# Patient Record
Sex: Male | Born: 1990 | Race: White | Hispanic: Yes | Smoking: Never smoker
Health system: Southern US, Community
[De-identification: ages and names within clinical notes are randomized; demographics above are authoritative.]

## PROBLEM LIST (undated history)

## (undated) DIAGNOSIS — T7840XA Allergy, unspecified, initial encounter: Secondary | ICD-10-CM

## (undated) DIAGNOSIS — I10 Essential (primary) hypertension: Secondary | ICD-10-CM

## (undated) HISTORY — DX: Allergy, unspecified, initial encounter: T78.40XA

---

## 2011-06-22 ENCOUNTER — Encounter: Payer: Self-pay | Admitting: *Deleted

## 2011-06-22 ENCOUNTER — Emergency Department (HOSPITAL_COMMUNITY)
Admission: EM | Admit: 2011-06-22 | Discharge: 2011-06-22 | Disposition: A | Discharge status: Home / Self Care | Payer: Self-pay | Attending: Emergency Medicine | Admitting: Emergency Medicine

## 2011-06-22 DIAGNOSIS — R109 Unspecified abdominal pain: Secondary | ICD-10-CM

## 2011-06-22 DIAGNOSIS — R509 Fever, unspecified: Secondary | ICD-10-CM | POA: Insufficient documentation

## 2011-06-22 DIAGNOSIS — R197 Diarrhea, unspecified: Secondary | ICD-10-CM | POA: Insufficient documentation

## 2011-06-22 DIAGNOSIS — R112 Nausea with vomiting, unspecified: Secondary | ICD-10-CM | POA: Insufficient documentation

## 2011-06-22 DIAGNOSIS — R1013 Epigastric pain: Secondary | ICD-10-CM | POA: Insufficient documentation

## 2011-06-22 LAB — COMPREHENSIVE METABOLIC PANEL
AST: 18 U/L (ref 0–37)
CO2: 26 mEq/L (ref 19–32)
Calcium: 9.5 mg/dL (ref 8.4–10.5)
Chloride: 99 mEq/L (ref 96–112)
Creatinine, Ser: 0.8 mg/dL (ref 0.50–1.35)
GFR calc Af Amer: >90 mL/min (ref >90)
GFR calc non Af Amer: >90 mL/min (ref >90)
Glucose, Bld: 106 mg/dL — ABNORMAL HIGH (ref 70–99)
Total Bilirubin: 0.8 mg/dL (ref 0.3–1.2)

## 2011-06-22 LAB — DIFFERENTIAL
Basophils Relative: 0 % (ref 0–1)
Eosinophils Absolute: 0 10*3/uL (ref 0.0–0.7)
Lymphs Abs: 0.8 10*3/uL (ref 0.7–4.0)
Neutro Abs: 10.9 10*3/uL — ABNORMAL HIGH (ref 1.7–7.7)
Neutrophils Relative %: 88 % — ABNORMAL HIGH (ref 43–77)

## 2011-06-22 LAB — CBC
MCH: 28.2 pg (ref 26.0–34.0)
MCHC: 34 g/dL (ref 30.0–36.0)
Platelets: 237 10*3/uL (ref 150–400)
RBC: 5.11 MIL/uL (ref 4.22–5.81)

## 2011-06-22 LAB — LIPASE, BLOOD: Lipase: 17 U/L (ref 11–59)

## 2011-06-22 MED ORDER — ONDANSETRON HCL 4 MG/2ML IJ SOLN
4.0000 mg | Freq: Once | INTRAMUSCULAR | Status: AC
  Administered 2011-06-22: 4 mg via INTRAVENOUS
  Filled 2011-06-22: qty 2

## 2011-06-22 MED ORDER — SODIUM CHLORIDE 0.9 % IV SOLN
999.0000 mL | Freq: Once | INTRAVENOUS | Status: AC
  Administered 2011-06-22: 999 mL via INTRAVENOUS

## 2011-06-22 MED ORDER — ONDANSETRON HCL 4 MG PO TABS
4.0000 mg | ORAL_TABLET | Freq: Four times a day (QID) | ORAL | Status: AC
Start: 2011-06-22 — End: 2011-06-29

## 2011-06-22 MED ORDER — GI COCKTAIL ~~LOC~~
30.0000 mL | Freq: Once | ORAL | Status: AC
  Administered 2011-06-22: 30 mL via ORAL
  Filled 2011-06-22: qty 30

## 2011-06-22 NOTE — ED Notes (Signed)
Reports waking up this am with fever, abd pain, nausea and diarrhea. Mask on pt at triage. HR 130.

## 2011-06-22 NOTE — ED Provider Notes (Cosign Needed)
History     CSN: [id number] Arrival date & time: 06/22/2011 12:13 PM   First MD Initiated Contact with Patient 06/22/11 1338      Chief Complaint  Patient presents with  . Fever  . Abdominal Pain    (Consider location/radiation/quality/duration/timing/severity/associated sxs/prior treatment) HPI The patient presents with 8 hours of abdominal discomfort, nausea, vomiting, diarrhea. The patient is a yesterday he was in his usual state of health, and soon after awakening today he noticed his symptoms. Since that time his symptoms have been persistent, in spite of Pepto-Bismol, other oral medications. He now notes by mouth intolerance, continued fever, continued nausea, abdominal pain (epigastric and nonradiating, sharp, burning) History reviewed. No pertinent past medical history.  History reviewed. No pertinent past surgical history.  History reviewed. No pertinent family history.  History  Substance Use Topics  . Smoking status: Not on file  . Smokeless tobacco: Not on file  . Alcohol Use: No      Review of Systems  Constitutional:       Per HPI, otherwise negative  HENT:       Per HPI, otherwise negative  Eyes: Negative.   Respiratory:       Per HPI, otherwise negative  Cardiovascular:       Per HPI, otherwise negative  Gastrointestinal: Positive for nausea, vomiting and diarrhea. Negative for rectal pain.  Genitourinary: Negative.   Musculoskeletal:       Per HPI, otherwise negative  Skin: Negative.   Neurological: Negative for syncope.    Allergies  Review of patient's allergies indicates no known allergies.  Home Medications   Current Outpatient Rx  Name Route Sig Dispense Refill  . ACETAMINOPHEN 500 MG PO TABS Oral Take 1,000 mg by mouth every 6 (six) hours as needed. For pain and fever     . ASPIRIN EFFERVESCENT 325 MG PO TBEF Oral Take 325 mg by mouth every 6 (six) hours as needed. For diarrhea       BP 123/70  Pulse 120  Temp(Src) 98.7 F (37.1  C) (Oral)  Resp 20  SpO2 95%  Physical Exam  Nursing note and vitals reviewed. Constitutional: He is oriented to person, place, and time. He appears well-developed and well-nourished.  HENT:  Head: Normocephalic and atraumatic.  Eyes: Conjunctivae are normal. Pupils are equal, round, and reactive to light.  Neck: Neck supple.  Cardiovascular: Regular rhythm.  Tachycardia present.   Pulmonary/Chest: No respiratory distress.  Abdominal: Soft. There is tenderness.  Musculoskeletal: He exhibits no edema.  Neurological: He is alert and oriented to person, place, and time.  Skin: Skin is warm and dry.  Psychiatric: He has a normal mood and affect.    ED Course  Procedures (including critical care time)   Labs Reviewed  COMPREHENSIVE METABOLIC PANEL  CBC  DIFFERENTIAL  LIPASE, BLOOD   No results found.   No diagnosis found.    MDM  This previously well young male presents with acute onset abdominal pain nausea, diarrhea. On exam the patient is in no distress though he appears uncomfortable. Minimal discomfort on exam about the epigastrium as well as his symptoms is suggestive of acute gastroenteritis. Following IV fluids, GI cocktail, Zofran the patient described a significant improvement in his condition. The patient's vital signs also improved notably. Given this improvement, the absence of abnormal laboratory values and the resolution of the patient's vital signs abnormalities he is appropriate for outpatient continued evaluation and management of his symptoms.  Gerhard Munch, MD 06/22/11 [phone]

## 2012-10-21 ENCOUNTER — Ambulatory Visit: Payer: Self-pay | Admitting: Family Medicine

## 2012-10-21 VITALS — BP 130/85 | HR 74 | Temp 98.4°F | Resp 16 | Ht 70.5 in | Wt 243.8 lb

## 2012-10-21 DIAGNOSIS — J039 Acute tonsillitis, unspecified: Secondary | ICD-10-CM

## 2012-10-21 DIAGNOSIS — J329 Chronic sinusitis, unspecified: Secondary | ICD-10-CM

## 2012-10-21 MED ORDER — CHLORHEXIDINE GLUCONATE 0.12 % MT SOLN: 15.0000 mL | Freq: Two times a day (BID) | OROMUCOSAL | Status: DC

## 2012-10-21 MED ORDER — AMOXICILLIN 875 MG PO TABS: 875.0000 mg | ORAL_TABLET | Freq: Two times a day (BID) | ORAL | Status: DC

## 2012-10-21 NOTE — Progress Notes (Signed)
22 yo man with two days of progressive left throat swelling, nasal congestion and cough.  No h/o asthma.  Nonsmoker. No alcohol Patient works at call center in [place]  Obj: NAD Skin warm and dry Throat:  Swollen left tonsil without exudate Neck:  Supple, no adenop Chest:  Clear Heart:  Reg, no murmur  Assessment:  Mild early tonsillitis  Plan:  No work for 48 hour Amoxil 875 bid

## 2013-10-08 ENCOUNTER — Ambulatory Visit: Payer: Self-pay | Admitting: Family Medicine

## 2013-10-08 VITALS — BP 128/84 | HR 96 | Temp 97.9°F | Resp 16 | Ht 70.5 in | Wt 235.6 lb

## 2013-10-08 DIAGNOSIS — J020 Streptococcal pharyngitis: Secondary | ICD-10-CM

## 2013-10-08 DIAGNOSIS — J039 Acute tonsillitis, unspecified: Secondary | ICD-10-CM

## 2013-10-08 DIAGNOSIS — R221 Localized swelling, mass and lump, neck: Secondary | ICD-10-CM

## 2013-10-08 DIAGNOSIS — H9209 Otalgia, unspecified ear: Secondary | ICD-10-CM

## 2013-10-08 DIAGNOSIS — J329 Chronic sinusitis, unspecified: Secondary | ICD-10-CM

## 2013-10-08 DIAGNOSIS — R220 Localized swelling, mass and lump, head: Secondary | ICD-10-CM

## 2013-10-08 LAB — POCT CBC
Granulocyte percent: 72.9 %G (ref 37–80)
HCT, POC: 44.1 % (ref 43.5–53.7)
HEMOGLOBIN: 14.2 g/dL (ref 14.1–18.1)
LYMPH, POC: 1.7 (ref 0.6–3.4)
MCH, POC: 27.9 pg (ref 27–31.2)
MCHC: 32.2 g/dL (ref 31.8–35.4)
MCV: 86.6 fL (ref 80–97)
MID (CBC): 0.4 (ref 0–0.9)
MPV: 8.4 fL (ref 0–99.8)
PLATELET COUNT, POC: 389 10*3/uL (ref 142–424)
POC GRANULOCYTE: 5.8 (ref 2–6.9)
POC LYMPH %: 21.8 % (ref 10–50)
POC MID %: 5.3 % (ref 0–12)
RBC: 5.09 M/uL (ref 4.69–6.13)
RDW, POC: 13.8 %
WBC: 7.9 10*3/uL (ref 4.6–10.2)

## 2013-10-08 LAB — POCT RAPID STREP A (OFFICE): Rapid Strep A Screen: POSITIVE — AB

## 2013-10-08 MED ORDER — AMOXICILLIN 875 MG PO TABS: 875.0000 mg | ORAL_TABLET | Freq: Two times a day (BID) | ORAL | Status: DC

## 2013-10-08 NOTE — Patient Instructions (Signed)
Take all antibiotic as ordered Ibuprofen 400-600 mg every 8 hours for pain  Strep Throat Strep throat is an infection of the throat caused by a bacteria named Streptococcus pyogenes. Your caregiver may call the infection streptococcal "tonsillitis" or "pharyngitis" depending on whether there are signs of inflammation in the tonsils or back of the throat. Strep throat is most common in children aged 22 15 years during the cold months of the year, but it can occur in people of any age during any season. This infection is spread from person to person (contagious) through coughing, sneezing, or other close contact. SYMPTOMS   Fever or chills.  Painful, swollen, red tonsils or throat.  Pain or difficulty when swallowing.  White or yellow spots on the tonsils or throat.  Swollen, tender lymph nodes or "glands" of the neck or under the jaw.  Red rash all over the body (rare). DIAGNOSIS  Many different infections can cause the same symptoms. A test must be done to confirm the diagnosis so the right treatment can be given. A "rapid strep test" can help your caregiver make the diagnosis in a few minutes. If this test is not available, a light swab of the infected area can be used for a throat culture test. If a throat culture test is done, results are usually available in a day or two. TREATMENT  Strep throat is treated with antibiotic medicine. HOME CARE INSTRUCTIONS   Gargle with 1 tsp of salt in 1 cup of warm water, 3 4 times per day or as needed for comfort.  Family members who also have a sore throat or fever should be tested for strep throat and treated with antibiotics if they have the strep infection.  Make sure everyone in your household washes their hands well.  Do not share food, drinking cups, or personal items that could cause the infection to spread to others.  You may need to eat a soft food diet until your sore throat gets better.  Drink enough water and fluids to keep your  urine clear or pale yellow. This will help prevent dehydration.  Get plenty of rest.  Stay home from school, daycare, or work until you have been on antibiotics for 24 hours.  Only take over-the-counter or prescription medicines for pain, discomfort, or fever as directed by your caregiver.  If antibiotics are prescribed, take them as directed. Finish them even if you start to feel better. SEEK MEDICAL CARE IF:   The glands in your neck continue to enlarge.  You develop a rash, cough, or earache.  You cough up green, yellow-brown, or bloody sputum.  You have pain or discomfort not controlled by medicines.  Your problems seem to be getting worse rather than better. SEEK IMMEDIATE MEDICAL CARE IF:   You develop any new symptoms such as vomiting, severe headache, stiff or painful neck, chest pain, shortness of breath, or trouble swallowing.  You develop severe throat pain, drooling, or changes in your voice.  You develop swelling of the neck, or the skin on the neck becomes red and tender.  You have a fever.  You develop signs of dehydration, such as fatigue, dry mouth, and decreased urination.  You become increasingly sleepy, or you cannot wake up completely. Document Released: 06/24/2000 Document Revised: 06/13/2012 Document Reviewed: 08/26/2010 [facility] Patient Information 2014 [place].

## 2013-10-08 NOTE — Progress Notes (Signed)
Subjective:    Patient ID: Jackson Wallace, male    DOB: 1990-09-01, 23 y.o.   MRN: [id number]  HPI Woke up in the middle of the night 4 days ago with a sharp pain in his left ear. The next morning, used some over the counter ear drops with some improvement of pain. Has noticed progressively worse pain radiating from ear, hurts with eating, feels swollen. Did not note any drainage from ear.  Took tylenol with some relief of feeling hot, did not help pain.  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   Review of Systems Felt hot yesterday, No nasal drainage, no sore throat, no cough. Hearing normal in ear.     Objective:   Physical Exam  Vitals reviewed. Constitutional: He is oriented to person, place, and time. He appears well-developed and well-nourished.  HENT:  Head: Normocephalic and atraumatic.  Right Ear: Hearing and external ear normal.  Left Ear: Hearing normal. There is tenderness.  Nose: Nose normal.  Mouth/Throat: Oropharynx is clear and moist.  Left ear canal with hypervascularization.  Eyes: Conjunctivae are normal. Right eye exhibits no discharge. Left eye exhibits no discharge.  Neck: Normal range of motion. Neck supple.  Left anterior cervical lymph nodes present and tender.  Cardiovascular: Normal rate, regular rhythm and normal heart sounds.   Pulmonary/Chest: Effort normal and breath sounds normal.  Musculoskeletal: Normal range of motion.  Lymphadenopathy:    He has cervical adenopathy.  Neurological: He is alert and oriented to person, place, and time.  Skin: Skin is warm and dry.  Psychiatric: He has a normal mood and affect. His behavior is normal. Judgment and thought content normal.   Patient reports some tenderness with otoscope exam, pain to palpation of jaw and upper neck.  He is noted to have mild swelling of left cheek and fullness anterior to ear.  Results for orders placed in visit on 10/08/13  POCT RAPID STREP A (OFFICE)   Result Value Ref Range   Rapid Strep A Screen Positive (*) Negative  POCT CBC      Result Value Ref Range   WBC 7.9  4.6 - 10.2 K/uL   Lymph, poc 1.7  0.6 - 3.4   POC LYMPH PERCENT 21.8  10 - 50 %L   MID (cbc) 0.4  0 - 0.9   POC MID % 5.3  0 - 12 %M   POC Granulocyte 5.8  2 - 6.9   Granulocyte percent 72.9  37 - 80 %G   RBC 5.09  4.69 - 6.13 M/uL   Hemoglobin 14.2  14.1 - 18.1 g/dL   HCT, POC 40.944.1  81.143.5 - 53.7 %   MCV 86.6  80 - 97 fL   MCH, POC 27.9  27 - 31.2 pg   MCHC 32.2  31.8 - 35.4 g/dL   RDW, POC 91.413.8     Platelet Count, POC 389  142 - 424 K/uL   MPV 8.4  0 - 99.8 fL       Assessment & Plan:  1. Facial swelling - POCT rapid strep A - POCT CBC  2. Otalgia - POCT rapid strep A - POCT CBC  3. Strep pharyngitis - amoxicillin (AMOXIL) 875 MG tablet; Take 1 tablet (875 mg total) by mouth 2 (two) times daily.  Dispense: 20 tablet; Refill: 0  Provided patient with written and oral instructions. Finish all antibiotic as directed. Ibuprofen 400-600 mg po q 8 PRN pain/swelling. RTC if symptoms  worsen.  Emi Belfast, FNP-BC  [facility]  10/08/2013 4:26 PM   Discussed with Ms. Leone Payor, NP and agree with above.    Eula Listen, MHS, PA-C [facility] [address] [place], [place] [address] [phone] [facility] 10/11/2013 7:28 PM

## 2014-10-17 ENCOUNTER — Ambulatory Visit (INDEPENDENT_AMBULATORY_CARE_PROVIDER_SITE_OTHER): Payer: Self-pay | Admitting: Family Medicine

## 2014-10-17 VITALS — BP 124/78 | HR 87 | Temp 98.6°F | Resp 17 | Ht 71.0 in | Wt 243.0 lb

## 2014-10-17 DIAGNOSIS — J029 Acute pharyngitis, unspecified: Secondary | ICD-10-CM

## 2014-10-17 LAB — POCT CBC
GRANULOCYTE PERCENT: 72.6 % (ref 37–80)
HEMATOCRIT: 45.1 % (ref 43.5–53.7)
HEMOGLOBIN: 14.7 g/dL (ref 14.1–18.1)
Lymph, poc: 1.7 (ref 0.6–3.4)
MCH, POC: 27.1 pg (ref 27–31.2)
MCHC: 32.6 g/dL (ref 31.8–35.4)
MCV: 83.3 fL (ref 80–97)
MID (cbc): 0.5 (ref 0–0.9)
MPV: 6.8 fL (ref 0–99.8)
POC GRANULOCYTE: 5.8 (ref 2–6.9)
POC LYMPH PERCENT: 21.5 %L (ref 10–50)
POC MID %: 5.9 % (ref 0–12)
Platelet Count, POC: 315 10*3/uL (ref 142–424)
RBC: 5.42 M/uL (ref 4.69–6.13)
RDW, POC: 14.8 %
WBC: 8 10*3/uL (ref 4.6–10.2)

## 2014-10-17 LAB — POCT RAPID STREP A (OFFICE): Rapid Strep A Screen: NEGATIVE

## 2014-10-17 MED ORDER — PENICILLIN V POTASSIUM 500 MG PO TABS: 500.0000 mg | ORAL_TABLET | Freq: Two times a day (BID) | ORAL | Status: DC

## 2014-10-17 NOTE — Progress Notes (Addendum)
[facility] [address], [place] [id number] [phone]  Date:  10/17/2014   Name:  Jackson ArgueJonathan De La Rosa-Medrano   DOB:  1991-05-21   MRN:  [id number]  PCP:  No primary care provider on file.    Chief Complaint: Ear Pain; Sore Throat; and burning in mouth   History of Present Illness:  Jackson ArgueJonathan De La Rosa-Medrano is a 24 y.o. very pleasant male patient who presents with the following:  Here today as a new patient with illness.  He notes a sore throat. Left ear is painful as well. The back of his mouth/ left side seems to be burning He noted onset this am when he woke up yesterday he had a little bit of a runny nose  He has a bit of cough, no sneezing His temp this am was about 98- 99 degrees he thinks  He is generally in good health.  There are no active problems to display for this patient.   Past Medical History  Diagnosis Date  . Allergy     No past surgical history on file.  History  Substance Use Topics  . Smoking status: Never Smoker   . Smokeless tobacco: Never Used  . Alcohol Use: No    Family History  Problem Relation Age of Onset  . Diabetes Maternal Grandmother     No Known Allergies  Medication list has been reviewed and updated.  Current Outpatient Prescriptions on File Prior to Visit  Medication Sig Dispense Refill  . acetaminophen (TYLENOL) 500 MG tablet Take 1,000 mg by mouth every 6 (six) hours as needed. For pain and fever      No current facility-administered medications on file prior to visit.    Review of Systems:  As per HPI- otherwise negative.   Physical Examination: Filed Vitals:   10/17/14 1551  BP: 124/78  Pulse: 87  Temp: 98.6 F (37 C)  Resp: 17   Filed Vitals:   10/17/14 1551  Height: 5\' 11"  (1.803 m)  Weight: 243 lb (110.224 kg)   Body mass index is 33.91 kg/(m^2). Ideal Body Weight: Weight in (lb) to have BMI = 25: 178.9  GEN: WDWN, NAD, Non-toxic, A & O x 3, looks well, large bone  structure  HEENT: Atraumatic, Normocephalic. Neck supple. No masses, small tender anterior cervical LAD.  Bilateral TM wnl, oropharynx shows enlarged left tonsil but no exudate.  PEERL,EOMI.   Ears and Nose: No external deformity. CV: RRR, No M/G/R. No JVD. No thrill. No extra heart sounds. PULM: CTA B, no wheezes, crackles, rhonchi. No retractions. No resp. distress. No accessory muscle use. ABD: S, NT, ND. No rebound. No HSM. EXTR: No c/c/e NEURO Normal gait.  PSYCH: Normally interactive. Conversant. Not depressed or anxious appearing.  Calm demeanor.   Results for orders placed or performed in visit on 10/17/14  POCT rapid strep A  Result Value Ref Range   Rapid Strep A Screen Negative Negative  POCT CBC  Result Value Ref Range   WBC 8.0 4.6 - 10.2 K/uL   Lymph, poc 1.7 0.6 - 3.4   POC LYMPH PERCENT 21.5 10 - 50 %L   MID (cbc) 0.5 0 - 0.9   POC MID % 5.9 0 - 12 %M   POC Granulocyte 5.8 2 - 6.9   Granulocyte percent 72.6 37 - 80 %G   RBC 5.42 4.69 - 6.13 M/uL   Hemoglobin 14.7 14.1 - 18.1 g/dL   HCT, POC 45.1  43.5 - 53.7 %   MCV 83.3 80 - 97 fL   MCH, POC 27.1 27 - 31.2 pg   MCHC 32.6 31.8 - 35.4 g/dL   RDW, POC 64.4 %   Platelet Count, POC 315 142 - 424 K/uL   MPV 6.8 0 - 99.8 fL    Assessment and Plan: Acute pharyngitis, unspecified pharyngitis type - Plan: POCT rapid strep A, Throat culture ([facility]), POCT CBC, Epstein-Barr virus VCA antibody panel, penicillin v potassium (VEETID) 500 MG tablet  Start treatment for possible strep with false negative rapid- penicillin Await throat culture and EBV and will stop abx if appropriate   Meds ordered this encounter  Medications  . penicillin v potassium (VEETID) 500 MG tablet    Sig: Take 1 tablet (500 mg total) by mouth 2 (two) times daily.    Dispense:  20 tablet    Refill:  0   Signed Abbe Amsterdam, MD  Called 4/11 with the rest of his labs.  Let him know that strep culture is negative and it looks like he's  already had mono. He may stop his abx.  He still has some ST and earache- will let me know if these are not better soon  Results for orders placed or performed in visit on 10/17/14  Throat culture [facility])  Result Value Ref Range   Organism ID, Bacteria Normal Upper Respiratory Flora    Organism ID, Bacteria No Beta Hemolytic Streptococci Isolated   Epstein-Barr virus VCA antibody panel  Result Value Ref Range   EBV VCA IgG >750.0 (H) <18.0 U/mL   EBV VCA IgM <10.0 <36.0 U/mL   EBV EA IgG 21.5 (H) <9.0 U/mL   EBV NA IgG <3.0 <18.0 U/mL  POCT rapid strep A  Result Value Ref Range   Rapid Strep A Screen Negative Negative  POCT CBC  Result Value Ref Range   WBC 8.0 4.6 - 10.2 K/uL   Lymph, poc 1.7 0.6 - 3.4   POC LYMPH PERCENT 21.5 10 - 50 %L   MID (cbc) 0.5 0 - 0.9   POC MID % 5.9 0 - 12 %M   POC Granulocyte 5.8 2 - 6.9   Granulocyte percent 72.6 37 - 80 %G   RBC 5.42 4.69 - 6.13 M/uL   Hemoglobin 14.7 14.1 - 18.1 g/dL   HCT, POC 03.4 74.2 - 53.7 %   MCV 83.3 80 - 97 fL   MCH, POC 27.1 27 - 31.2 pg   MCHC 32.6 31.8 - 35.4 g/dL   RDW, POC 59.5 %   Platelet Count, POC 315 142 - 424 K/uL   MPV 6.8 0 - 99.8 fL

## 2014-10-17 NOTE — Patient Instructions (Signed)
Start the penicillin now for possible strep throat.  I will be in touch with the rest of your labs- if your throat culture is negative we will stop the penicillin.   Take care and let me know if you are not feeling better in the next couple of days-Sooner if worse.

## 2014-10-19 LAB — CULTURE, GROUP A STREP: ORGANISM ID, BACTERIA: NORMAL

## 2014-10-20 LAB — EPSTEIN-BARR VIRUS VCA ANTIBODY PANEL
EBV EA IgG: 21.5 U/mL — ABNORMAL HIGH (ref <9.0)
EBV NA IgG: <3 U/mL (ref <18.0)
EBV VCA IgM: <10 U/mL (ref <36.0)

## 2015-04-22 ENCOUNTER — Ambulatory Visit (INDEPENDENT_AMBULATORY_CARE_PROVIDER_SITE_OTHER): Payer: Self-pay | Admitting: Family Medicine

## 2015-04-22 VITALS — BP 118/86 | HR 86 | Temp 98.3°F | Resp 16 | Ht 71.0 in | Wt 251.2 lb

## 2015-04-22 DIAGNOSIS — J069 Acute upper respiratory infection, unspecified: Secondary | ICD-10-CM

## 2015-04-22 MED ORDER — PSEUDOEPHEDRINE HCL ER 120 MG PO TB12: 120.0000 mg | ORAL_TABLET | Freq: Two times a day (BID) | ORAL | Status: DC

## 2015-04-22 MED ORDER — OXYMETAZOLINE HCL 0.05 % NA SOLN: 1.0000 | Freq: Two times a day (BID) | NASAL | Status: DC

## 2015-04-22 MED ORDER — FLUTICASONE PROPIONATE 50 MCG/ACT NA SUSP: 2.0000 | Freq: Every day | NASAL | Status: DC

## 2015-04-22 MED ORDER — CETIRIZINE HCL 10 MG PO TABS: 10.0000 mg | ORAL_TABLET | Freq: Every day | ORAL | Status: DC

## 2015-04-22 MED ORDER — BENZONATATE 200 MG PO CAPS: 200.0000 mg | ORAL_CAPSULE | Freq: Three times a day (TID) | ORAL | Status: DC | PRN

## 2015-04-22 MED ORDER — AZITHROMYCIN 250 MG PO TABS: ORAL_TABLET | ORAL | Status: DC

## 2015-04-22 NOTE — Patient Instructions (Signed)
Upper Respiratory Infection, Adult Most upper respiratory infections (URIs) are a viral infection of the air passages leading to the lungs. A URI affects the nose, throat, and upper air passages. The most common type of URI is nasopharyngitis and is typically referred to as "the common cold." URIs run their course and usually go away on their own. Most of the time, a URI does not require medical attention, but sometimes a bacterial infection in the upper airways can follow a viral infection. This is called a secondary infection. Sinus and middle ear infections are common types of secondary upper respiratory infections. Bacterial pneumonia can also complicate a URI. A URI can worsen asthma and chronic obstructive pulmonary disease (COPD). Sometimes, these complications can require emergency medical care and may be life threatening.  CAUSES Almost all URIs are caused by viruses. A virus is a type of germ and can spread from one person to another.  RISKS FACTORS You may be at risk for a URI if:   You smoke.   You have chronic heart or lung disease.  You have a weakened defense (immune) system.   You are very young or very old.   You have nasal allergies or asthma.  You work in crowded or poorly ventilated areas.  You work in health care facilities or schools. SIGNS AND SYMPTOMS  Symptoms typically develop 2-3 days after you come in contact with a cold virus. Most viral URIs last 7-10 days. However, viral URIs from the influenza virus (flu virus) can last 14-18 days and are typically more severe. Symptoms may include:   Runny or stuffy (congested) nose.   Sneezing.   Cough.   Sore throat.   Headache.   Fatigue.   Fever.   Loss of appetite.   Pain in your forehead, behind your eyes, and over your cheekbones (sinus pain).  Muscle aches.  DIAGNOSIS  Your health care provider may diagnose a URI by:  Physical exam.  Tests to check that your symptoms are not due to  another condition such as:  Strep throat.  Sinusitis.  Pneumonia.  Asthma. TREATMENT  A URI goes away on its own with time. It cannot be cured with medicines, but medicines may be prescribed or recommended to relieve symptoms. Medicines may help:  Reduce your fever.  Reduce your cough.  Relieve nasal congestion. HOME CARE INSTRUCTIONS   Take medicines only as directed by your health care provider.   Gargle warm saltwater or take cough drops to comfort your throat as directed by your health care provider.  Use a warm mist humidifier or inhale steam from a shower to increase air moisture. This may make it easier to breathe.  Drink enough fluid to keep your urine clear or pale yellow.   Eat soups and other clear broths and maintain good nutrition.   Rest as needed.   Return to work when your temperature has returned to normal or as your health care provider advises. You may need to stay home longer to avoid infecting others. You can also use a face mask and careful hand washing to prevent spread of the virus.  Increase the usage of your inhaler if you have asthma.   Do not use any tobacco products, including cigarettes, chewing tobacco, or electronic cigarettes. If you need help quitting, ask your health care provider. PREVENTION  The best way to protect yourself from getting a cold is to practice good hygiene.   Avoid oral or hand contact with people with cold   symptoms.   Wash your hands often if contact occurs.  There is no clear evidence that vitamin C, vitamin E, echinacea, or exercise reduces the chance of developing a cold. However, it is always recommended to get plenty of rest, exercise, and practice good nutrition.  SEEK MEDICAL CARE IF:   You are getting worse rather than better.   Your symptoms are not controlled by medicine.   You have chills.  You have worsening shortness of breath.  You have brown or red mucus.  You have yellow or brown nasal  discharge.  You have pain in your face, especially when you bend forward.  You have a fever.  You have swollen neck glands.  You have pain while swallowing.  You have white areas in the back of your throat. SEEK IMMEDIATE MEDICAL CARE IF:   You have severe or persistent:  Headache.  Ear pain.  Sinus pain.  Chest pain.  You have chronic lung disease and any of the following:  Wheezing.  Prolonged cough.  Coughing up blood.  A change in your usual mucus.  You have a stiff neck.  You have changes in your:  Vision.  Hearing.  Thinking.  Mood. MAKE SURE YOU:   Understand these instructions.  Will watch your condition.  Will get help right away if you are not doing well or get worse.   This information is not intended to replace advice given to you by your health care provider. Make sure you discuss any questions you have with your health care provider.   Document Released: 12/21/2000 Document Revised: 11/11/2014 Document Reviewed: 10/02/2013 [employer] Interactive Patient Education 2016 [employer]  

## 2015-04-22 NOTE — Progress Notes (Signed)
Subjective:  This chart was scribed for Jackson Sorenson, MD by Stann Ore, Medical Scribe. This patient was seen in Room 2 and the patient's care was started 9:06 AM.    Patient ID: Jackson Wallace, male    DOB: 04/25/1991, 24 y.o.   MRN: [id number] Chief Complaint  Patient presents with  . Cough    x 3 weeks  . chest congestion  . Generalized Body Aches  . Chills  . Nausea  . Nasal Congestion    HPI Jackson Wallace is a 24 y.o. male who presents to [facility] complaining of cough that was noticed about 2.5 weeks ago. The moment he noticed the coughs, he started taking nyquil but the symptoms worsened. He continued to take OTC medications including dayquil, ibuprofen, and tylenol. He noticed getting chills, congestion and cough attacks starting 2 days ago. He states that his left ear feels full but used some ear drops for relief. He says that he's eating and drinking without any problems. His BM is normal. He denies fever. He denies taking allergy medications.   He mentions that his girlfriend is sick but not as bad. She has seen a doctor and was informed she has HPV.   Past Medical History  Diagnosis Date  . Allergy    Prior to Admission medications   Medication Sig Start Date End Date Taking? Authorizing Provider  acetaminophen (TYLENOL) 500 MG tablet Take 1,000 mg by mouth every 6 (six) hours as needed. For pain and fever    Yes Historical Provider, MD  DM-Doxylamine-Acetaminophen (NYQUIL COLD & FLU PO) Take by mouth.   Yes Historical Provider, MD  penicillin v potassium (VEETID) 500 MG tablet Take 1 tablet (500 mg total) by mouth 2 (two) times daily. Patient not taking: Reported on 04/22/2015 10/17/14   Pearline Cables, MD   No Known Allergies   Review of Systems  Constitutional: Positive for chills and fatigue. Negative for fever.  HENT: Positive for congestion. Negative for sinus pressure and sore throat.   Respiratory: Positive for cough and chest  tightness.   Gastrointestinal: Positive for nausea. Negative for vomiting, diarrhea, constipation and blood in stool.  Musculoskeletal: Positive for myalgias (general body).       Objective:   Physical Exam  Constitutional: He is oriented to person, place, and time. He appears well-developed and well-nourished. No distress.  HENT:  Head: Normocephalic and atraumatic.  Right Ear: Tympanic membrane normal.  Left Ear: Tympanic membrane is retracted. A middle ear effusion is present.  Mouth/Throat: Posterior oropharyngeal erythema present. No oropharyngeal exudate.  Nose pale boggy, 3+ tonsils  Eyes: EOM are normal. Pupils are equal, round, and reactive to light.  Neck: Neck supple. No thyromegaly present.  Cardiovascular: Normal rate, regular rhythm and normal heart sounds.   Pulmonary/Chest: Effort normal and breath sounds normal. No respiratory distress.  Musculoskeletal: Normal range of motion.  Lymphadenopathy:    He has no cervical adenopathy.  Neurological: He is alert and oriented to person, place, and time.  Skin: Skin is warm and dry.  Psychiatric: He has a normal mood and affect. His behavior is normal.  Nursing note and vitals reviewed.   BP 118/86 mmHg  Pulse 86  Temp(Src) 98.3 F (36.8 C) (Oral)  Resp 16  Ht  (1.803 m)  Wt 251 lb 3.2 oz (113.944 kg)  BMI 35.05 kg/m2  SpO2 98%       Assessment & Plan:  Treat symptomatically but snap zpak  hard rx as given in case sxs worsen 1. Acute upper respiratory infection      Meds ordered this encounter  Medications  . DM-Doxylamine-Acetaminophen (NYQUIL COLD & FLU PO)    Sig: Take by mouth.  . fluticasone (FLONASE) 50 MCG/ACT nasal spray    Sig: Place 2 sprays into both nostrils at bedtime.    Dispense:  16 g    Refill:  2  . cetirizine (ZYRTEC) 10 MG tablet    Sig: Take 1 tablet (10 mg total) by mouth at bedtime.    Dispense:  30 tablet    Refill:  11  . benzonatate (TESSALON) 200 MG capsule    Sig:  Take 1 capsule (200 mg total) by mouth 3 (three) times daily as needed for cough.    Dispense:  30 capsule    Refill:  0  . oxymetazoline (AFRIN NASAL SPRAY) 0.05 % nasal spray    Sig: Place 1 spray into both nostrils 2 (two) times daily. X 3 days only, then stop    Dispense:  14.7 mL    Refill:  0  . pseudoephedrine (SUDAFED 12 HOUR) 120 MG 12 hr tablet    Sig: Take 1 tablet (120 mg total) by mouth 2 (two) times daily.    Dispense:  30 tablet    Refill:  0  . azithromycin (ZITHROMAX) 250 MG tablet    Sig: Take 2 tabs PO x 1 dose, then 1 tab PO QD x 4 days    Dispense:  6 tablet    Refill:  0    I personally performed the services described in this documentation, which was scribed in my presence. The recorded information has been reviewed and considered, and addended by me as needed.  Jackson SorensonEva Elexa Kivi, MD MPH   By signing my name below, I, Stann Oresung-Kai Tsai, attest that this documentation has been prepared under the direction and in the presence of Jackson SorensonEva Larinda Herter, MD. Electronically Signed: Stann Oresung-Kai Tsai, Scribe. 04/22/2015 , 9:09 AM .

## 2016-12-20 ENCOUNTER — Ambulatory Visit (INDEPENDENT_AMBULATORY_CARE_PROVIDER_SITE_OTHER): Payer: BLUE CROSS/BLUE SHIELD | Admitting: Student

## 2016-12-20 ENCOUNTER — Encounter: Payer: Self-pay | Admitting: Student

## 2016-12-20 VITALS — BP 132/83 | HR 68 | Temp 98.2°F | Resp 16 | Ht 71.0 in | Wt 271.0 lb

## 2016-12-20 DIAGNOSIS — Z23 Encounter for immunization: Secondary | ICD-10-CM | POA: Diagnosis not present

## 2016-12-20 DIAGNOSIS — Z7189 Other specified counseling: Secondary | ICD-10-CM | POA: Diagnosis not present

## 2016-12-20 DIAGNOSIS — Z0000 Encounter for general adult medical examination without abnormal findings: Secondary | ICD-10-CM | POA: Diagnosis not present

## 2016-12-20 DIAGNOSIS — Z7185 Encounter for immunization safety counseling: Secondary | ICD-10-CM | POA: Insufficient documentation

## 2016-12-20 MED ORDER — MENINGOCOCCAL A C Y&W-135 CONJ IM INJ: 0.5000 mL | INJECTION | Freq: Once | INTRAMUSCULAR | 0 refills | Status: DC

## 2016-12-20 MED ORDER — VARICELLA VIRUS VACCINE LIVE 1350 PFU/0.5ML IJ SUSR: 0.5000 mL | Freq: Once | INTRAMUSCULAR | Status: DC

## 2016-12-20 NOTE — Assessment & Plan Note (Signed)
Vaccine update performed.  No acute complaints today.  Follow up yearly for wellness exam.  Diet/exercise discussed.  Losing weight discussed.

## 2016-12-20 NOTE — Patient Instructions (Addendum)
  You need your 2nd HPV vaccine in 2 months or later, then 3rd one 4 months after that.   IF you received an x-ray today, you will receive an invoice from [facility]. Please contact [facility] at [phone] with questions or concerns regarding your invoice.   IF you received labwork today, you will receive an invoice from [facility]. Please contact [facility] at [phone] with questions or concerns regarding your invoice.   Our billing staff will not be able to assist you with questions regarding bills from these companies.  You will be contacted with the lab results as soon as they are available. The fastest way to get your results is to activate your My Chart account. Instructions are located on the last page of this paperwork. If you have not heard from us regarding the results in 2 weeks, please contact this office.

## 2016-12-20 NOTE — Progress Notes (Signed)
Subjective:    Patient ID: Jackson Wallace, male    DOB: 10-16-1990, 26 y.o.   MRN: [id number]  HPI Presents for annual exam.  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  He will be attending [employer] starting in fall, majoring in Biology.  Needs vaccination update.  He has no complaints today.  Does not wish to be screened for STD's.  Doing well at work.  No emotional problems.     PMHx - Updated and reviewed.  Contributory factors include: Negative PSHx - Updated and reviewed.  Contributory factors include:  Negative FHx - Updated and reviewed.  Contributory factors include:  Negative Social Hx - Updated and reviewed. Contributory factors include: No smoking or alcohol use Medications - reviewed   Review of Systems  Constitutional: Negative for chills, fatigue and fever.  HENT: Negative for congestion, ear discharge, ear pain, rhinorrhea, sneezing, sore throat and voice change.   Eyes: Negative for pain and itching.  Respiratory: Negative for cough and shortness of breath.   Cardiovascular: Negative for chest pain and leg swelling.  Gastrointestinal: Negative for abdominal pain, blood in stool, constipation, diarrhea and nausea.  Endocrine: Negative for cold intolerance, heat intolerance, polydipsia, polyphagia and polyuria.  Genitourinary: Negative for dysuria and urgency.  Musculoskeletal: Negative for joint swelling and myalgias.  Skin: Negative for pallor, rash and wound.  Neurological: Negative for dizziness and weakness.  Hematological: Negative for adenopathy. Does not bruise/bleed easily.  Psychiatric/Behavioral: Negative for behavioral problems. The patient is not nervous/anxious.   All other systems reviewed and are negative.      Objective:   Physical Exam  Constitutional: He is oriented to person, place, and time. He appears well-developed and well-nourished. No distress.  HENT:  Head: Normocephalic and atraumatic.    Right Ear: External ear normal.  Left Ear: External ear normal.  Nose: Nose normal.  Mouth/Throat: Oropharynx is clear and moist. No oropharyngeal exudate.  Eyes: Conjunctivae are normal. Pupils are equal, round, and reactive to light. No scleral icterus.  Neck: Normal range of motion. Neck supple. No JVD present. No thyromegaly present.  Cardiovascular: Normal rate, regular rhythm, normal heart sounds and intact distal pulses.  Exam reveals no gallop and no friction rub.   No murmur heard. Pulmonary/Chest: Effort normal and breath sounds normal. No respiratory distress. He has no wheezes. He has no rales. He exhibits no tenderness.  Abdominal: Soft. Bowel sounds are normal. He exhibits no distension and no mass. There is no tenderness. There is no rebound and no guarding.  Musculoskeletal: He exhibits no edema, tenderness or deformity.  Lymphadenopathy:    He has no cervical adenopathy.  Neurological: He is alert and oriented to person, place, and time. He displays normal reflexes. No cranial nerve deficit. He exhibits normal muscle tone. Coordination normal.  Skin: Skin is warm. No rash noted. He is not diaphoretic. No erythema. No pallor.  Psychiatric: He has a normal mood and affect. His behavior is normal. Judgment and thought content normal.  Nursing note and vitals reviewed.  BP 132/83 (BP Location: Right Arm, Patient Position: Sitting, Cuff Size: Large)   Pulse 68   Temp 98.2 F (36.8 C) (Oral)   Resp 16   Ht 5\' 11"  (1.803 m)   Wt 271 lb (122.9 kg)   SpO2 97%   BMI 37.80 kg/m         Assessment & Plan:  Annual physical exam Vaccine update performed.  No  acute complaints today.  Follow up yearly for wellness exam.  Diet/exercise discussed.  Losing weight discussed.

## 2017-02-23 ENCOUNTER — Ambulatory Visit: Payer: BLUE CROSS/BLUE SHIELD | Admitting: Urgent Care

## 2017-09-25 DIAGNOSIS — M9905 Segmental and somatic dysfunction of pelvic region: Secondary | ICD-10-CM | POA: Diagnosis not present

## 2017-09-25 DIAGNOSIS — M5416 Radiculopathy, lumbar region: Secondary | ICD-10-CM | POA: Diagnosis not present

## 2017-09-25 DIAGNOSIS — M9903 Segmental and somatic dysfunction of lumbar region: Secondary | ICD-10-CM | POA: Diagnosis not present

## 2017-09-25 DIAGNOSIS — M9902 Segmental and somatic dysfunction of thoracic region: Secondary | ICD-10-CM | POA: Diagnosis not present

## 2019-04-26 ENCOUNTER — Ambulatory Visit (INDEPENDENT_AMBULATORY_CARE_PROVIDER_SITE_OTHER): Payer: BC Managed Care – PPO | Admitting: Registered Nurse

## 2019-04-26 ENCOUNTER — Encounter: Payer: Self-pay | Admitting: Registered Nurse

## 2019-04-26 ENCOUNTER — Other Ambulatory Visit (HOSPITAL_COMMUNITY)
Admission: RE | Admit: 2019-04-26 | Discharge: 2019-04-26 | Disposition: A | Discharge status: Home / Self Care | Payer: BC Managed Care – PPO | Source: Ambulatory Visit | Attending: Registered Nurse | Admitting: Registered Nurse

## 2019-04-26 ENCOUNTER — Other Ambulatory Visit: Payer: Self-pay

## 2019-04-26 VITALS — BP 142/90 | HR 70 | Temp 99.0°F | Resp 16 | Ht 71.26 in | Wt 282.0 lb

## 2019-04-26 DIAGNOSIS — Z13220 Encounter for screening for lipoid disorders: Secondary | ICD-10-CM

## 2019-04-26 DIAGNOSIS — Z113 Encounter for screening for infections with a predominantly sexual mode of transmission: Secondary | ICD-10-CM | POA: Diagnosis not present

## 2019-04-26 DIAGNOSIS — Z130 Encounter for screening for diseases of the blood and blood-forming organs and certain disorders involving the immune mechanism: Secondary | ICD-10-CM | POA: Diagnosis not present

## 2019-04-26 DIAGNOSIS — Z13228 Encounter for screening for other metabolic disorders: Secondary | ICD-10-CM

## 2019-04-26 DIAGNOSIS — Z23 Encounter for immunization: Secondary | ICD-10-CM | POA: Diagnosis not present

## 2019-04-26 DIAGNOSIS — Z0000 Encounter for general adult medical examination without abnormal findings: Secondary | ICD-10-CM

## 2019-04-26 DIAGNOSIS — Z1329 Encounter for screening for other suspected endocrine disorder: Secondary | ICD-10-CM

## 2019-04-26 NOTE — Progress Notes (Signed)
Established Patient Office Visit  Subjective:  Patient ID: Jackson Wallace, male    DOB: Nov 08, 1990  Age: 28 y.o. MRN: [id number]  CC:  Chief Complaint  Patient presents with  . Annual Exam    HPI Jackson Wallace presents for CPE  No major concerns. Deals with seasonal allergies, willing to try cetirizine. Will send 30 tabs with 11 fills, explained to take it as needed. Preferably start with taking it at night or on days off to monitor reaction prior to taking it before work.  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 Enjoys it. Lives with partner, monogamous. No children. Concerned about some recent weight gain with COVID.  Past Medical History:  Diagnosis Date  . Allergy     History reviewed. No pertinent surgical history.  Family History  Problem Relation Age of Onset  . Diabetes Maternal Grandmother     Social History   Socioeconomic History  . Marital status: Significant Other    Spouse name: Not on file  . Number of children: 0  . Years of education: Not on file  . Highest education level: Not on file  Occupational History  . Occupation: driver  Social Needs  . Financial resource strain: Not hard at all  . Food insecurity    Worry: Never true    Inability: Never true  . Transportation needs    Medical: No    Non-medical: No  Tobacco Use  . Smoking status: Never Smoker  . Smokeless tobacco: Never Used  Substance and Sexual Activity  . Alcohol use: No  . Drug use: No  . Sexual activity: Not Currently  Lifestyle  . Physical activity    Days per week: 3 days    Minutes per session: 30 min  . Stress: Not at all  Relationships  . Social Musicianconnections    Talks on phone: Three times a week    Gets together: Twice a week    Attends religious service: Patient refused    Active member of club or organization: Patient refused    Attends meetings of clubs or organizations: Patient refused    Relationship status: Living with partner  .  Intimate partner violence    Fear of current or ex partner: No    Emotionally abused: No    Physically abused: No    Forced sexual activity: No  Other Topics Concern  . Not on file  Social History Narrative  . Not on file    No outpatient medications prior to visit.   No facility-administered medications prior to visit.     No Known Allergies  ROS Review of Systems  Constitutional: Negative.   HENT: Negative.   Eyes: Negative.   Respiratory: Negative.   Cardiovascular: Negative.   Gastrointestinal: Negative.   Endocrine: Negative.   Genitourinary: Negative.   Musculoskeletal: Negative.   Skin: Negative.   Allergic/Immunologic: Positive for environmental allergies.  Neurological: Negative.   Hematological: Negative.   Psychiatric/Behavioral: Negative.       Objective:    Physical Exam  Constitutional: He appears well-developed and well-nourished. No distress.  HENT:  Head: Normocephalic and atraumatic.  Right Ear: External ear normal.  Left Ear: External ear normal.  Nose: Nose normal.  Mouth/Throat: Oropharynx is clear and moist. No oropharyngeal exudate.  Eyes: Pupils are equal, round, and reactive to light. Conjunctivae, EOM and lids are normal. Lids are everted and swept, no foreign bodies found. Right eye exhibits no discharge and no exudate.  No foreign body present in the right eye. Left eye exhibits no discharge and no exudate. No foreign body present in the left eye. No scleral icterus.  Fundoscopic exam:      The right eye shows no arteriolar narrowing, no AV nicking, no exudate, no hemorrhage and no papilledema.       The left eye shows no arteriolar narrowing, no AV nicking, no exudate, no hemorrhage and no papilledema.  Neck: Normal range of motion. Neck supple. No tracheal deviation present. No thyromegaly present.  Cardiovascular: Normal rate, regular rhythm, normal heart sounds and intact distal pulses. Exam reveals no gallop and no friction rub.  No  murmur heard. Pulmonary/Chest: Effort normal and breath sounds normal. No respiratory distress. He has no wheezes. He has no rales. He exhibits no tenderness.  Abdominal: Soft. Bowel sounds are normal. He exhibits no distension and no mass. There is no abdominal tenderness. There is no rebound and no guarding.  Musculoskeletal: Normal range of motion.        General: No tenderness, deformity or edema.  Lymphadenopathy:    He has no cervical adenopathy.  Neurological: He is alert. He has normal reflexes. No cranial nerve deficit. He exhibits normal muscle tone. Coordination normal. GCS eye subscore is 4. GCS verbal subscore is 5. GCS motor subscore is 6.  Skin: Skin is warm and dry. No rash noted. He is not diaphoretic. No erythema. No pallor.  Some dispersed skin tags, nothing suspicious for malignancy.   Psychiatric: He has a normal mood and affect. His speech is normal and behavior is normal. Judgment and thought content normal. Cognition and memory are normal.  Nursing note and vitals reviewed.   BP (!) 142/90   Pulse 70   Temp 99 F (37.2 C) (Oral)   Resp 16   Ht 5' 11.26" (1.81 m)   Wt 282 lb (127.9 kg)   SpO2 96%   BMI 39.04 kg/m  Wt Readings from Last 3 Encounters:  04/26/19 282 lb (127.9 kg)  12/20/16 271 lb (122.9 kg)  04/22/15 251 lb 3.2 oz (113.9 kg)     Health Maintenance Due  Topic Date Due  . HIV Screening  07/06/2007    There are no preventive care reminders to display for this patient.  No results found for: TSH Lab Results  Component Value Date   WBC 8.0 10/17/2014   HGB 14.7 10/17/2014   HCT 45.1 10/17/2014   MCV 83.3 10/17/2014   PLT 237 06/22/2011   Lab Results  Component Value Date   NA 138 06/22/2011   K 3.6 06/22/2011   CO2 26 06/22/2011   GLUCOSE 106 (H) 06/22/2011   BUN 13 06/22/2011   CREATININE 0.80 06/22/2011   BILITOT 0.8 06/22/2011   ALKPHOS 99 06/22/2011   AST 18 06/22/2011   ALT 22 06/22/2011   PROT 8.3 06/22/2011   ALBUMIN  4.5 06/22/2011   CALCIUM 9.5 06/22/2011   No results found for: CHOL No results found for: HDL No results found for: LDLCALC No results found for: TRIG No results found for: CHOLHDL No results found for: VQQV9D    Assessment & Plan:   Problem List Items Addressed This Visit    None    Visit Diagnoses    Screening for endocrine, metabolic and immunity disorder    -  Primary   Relevant Orders   CBC with Differential/Platelet   Comprehensive metabolic panel   Hemoglobin A1c   TSH   Screen for STD (  sexually transmitted disease)       Relevant Orders   HIV Antibody (routine testing w rflx)   RPR   Urine cytology ancillary only   Flu vaccine need       Relevant Orders   Flu Vaccine QUAD 36+ mos IM (Completed)   Lipid screening       Relevant Orders   Lipid panel   Routine general medical examination at a health care facility          No orders of the defined types were placed in this encounter.   Follow-up: Return in 1 year (on 04/25/2020) for CPE and labs.   PLAN  Physical exam with normal findings.   Cetirizine 10mg  PO qd for seasonal allergies  Labs drawn, will follow up as warranted  History reviewed  Discussed weight loss: diet and exercise are cornerstones. Diet: eat real food, not too much, mostly plants. Exercise: start with mild to moderate as tolerated at least 30 minutes a day 5 days each week. Ideally increase to 1 hour each day for 5 days each week. Set SMART goals for weight loss, specific, measurable, attainable, relevant, and time-based.  Patient encouraged to call clinic with any questions, comments, or concerns.   Maximiano Coss, NP

## 2019-04-26 NOTE — Patient Instructions (Signed)
° °Health Maintenance, Male °Adopting a healthy lifestyle and getting preventive care are important in promoting health and wellness. Ask your health care provider about: °· The right schedule for you to have regular tests and exams. °· Things you can do on your own to prevent diseases and keep yourself healthy. °What should I know about diet, weight, and exercise? °Eat a healthy diet ° °· Eat a diet that includes plenty of vegetables, fruits, low-fat dairy products, and lean protein. °· Do not eat a lot of foods that are high in solid fats, added sugars, or sodium. °Maintain a healthy weight °Body mass index (BMI) is a measurement that can be used to identify possible weight problems. It estimates body fat based on height and weight. Your health care provider can help determine your BMI and help you achieve or maintain a healthy weight. °Get regular exercise °Get regular exercise. This is one of the most important things you can do for your health. Most adults should: °· Exercise for at least 150 minutes each week. The exercise should increase your heart rate and make you sweat (moderate-intensity exercise). °· Do strengthening exercises at least twice a week. This is in addition to the moderate-intensity exercise. °· Spend less time sitting. Even light physical activity can be beneficial. °Watch cholesterol and blood lipids °Have your blood tested for lipids and cholesterol at 27 years of age, then have this test every 5 years. °You may need to have your cholesterol levels checked more often if: °· Your lipid or cholesterol levels are high. °· You are older than 27 years of age. °· You are at high risk for heart disease. °What should I know about cancer screening? °Many types of cancers can be detected early and may often be prevented. Depending on your health history and family history, you may need to have cancer screening at various ages. This may include screening for: °· Colorectal cancer. °· Prostate  cancer. °· Skin cancer. °· Lung cancer. °What should I know about heart disease, diabetes, and high blood pressure? °Blood pressure and heart disease °· High blood pressure causes heart disease and increases the risk of stroke. This is more likely to develop in people who have high blood pressure readings, are of African descent, or are overweight. °· Talk with your health care provider about your target blood pressure readings. °· Have your blood pressure checked: °? Every 3-5 years if you are 18-39 years of age. °? Every year if you are 40 years old or older. °· If you are between the ages of 65 and 75 and are a current or former smoker, ask your health care provider if you should have a one-time screening for abdominal aortic aneurysm (AAA). °Diabetes °Have regular diabetes screenings. This checks your fasting blood sugar level. Have the screening done: °· Once every three years after age 45 if you are at a normal weight and have a low risk for diabetes. °· More often and at a younger age if you are overweight or have a high risk for diabetes. °What should I know about preventing infection? °Hepatitis B °If you have a higher risk for hepatitis B, you should be screened for this virus. Talk with your health care provider to find out if you are at risk for hepatitis B infection. °Hepatitis C °Blood testing is recommended for: °· Everyone born from 1945 through 1965. °· Anyone with known risk factors for hepatitis C. °Sexually transmitted infections (STIs) °· You should be screened each   year for STIs, including gonorrhea and chlamydia, if: ? You are sexually active and are younger than 27 years of age. ? You are older than 27 years of age and your health care provider tells you that you are at risk for this type of infection. ? Your sexual activity has changed since you were last screened, and you are at increased risk for chlamydia or gonorrhea. Ask your health care provider if you are at risk.  Ask your  health care provider about whether you are at high risk for HIV. Your health care provider may recommend a prescription medicine to help prevent HIV infection. If you choose to take medicine to prevent HIV, you should first get tested for HIV. You should then be tested every 3 months for as long as you are taking the medicine. Follow these instructions at home: Lifestyle  Do not use any products that contain nicotine or tobacco, such as cigarettes, e-cigarettes, and chewing tobacco. If you need help quitting, ask your health care provider.  Do not use street drugs.  Do not share needles.  Ask your health care provider for help if you need support or information about quitting drugs. Alcohol use  Do not drink alcohol if your health care provider tells you not to drink.  If you drink alcohol: ? Limit how much you have to 0-2 drinks a day. ? Be aware of how much alcohol is in your drink. In the [place] one drink equals one 12 oz bottle of beer (355 mL), one 5 oz glass of wine (148 mL), or one 1 oz glass of hard liquor (44 mL). General instructions  Schedule regular health, dental, and eye exams.  Stay current with your vaccines.  Tell your health care provider if: ? You often feel depressed. ? You have ever been abused or do not feel safe at home. Summary  Adopting a healthy lifestyle and getting preventive care are important in promoting health and wellness.  Follow your health care provider's instructions about healthy diet, exercising, and getting tested or screened for diseases.  Follow your health care provider's instructions on monitoring your cholesterol and blood pressure. This information is not intended to replace advice given to you by your health care provider. Make sure you discuss any questions you have with your health care provider. Document Released: 12/24/2007 Document Revised: 06/20/2018 Document Reviewed: 06/20/2018 Elsevier Patient Education  2020 [employer].     Why follow it? Research shows  Those who follow the Mediterranean diet have a reduced risk of heart disease   The diet is associated with a reduced incidence of Parkinson's and Alzheimer's diseases  People following the diet may have longer life expectancies and lower rates of chronic diseases   The Dietary Guidelines for Americans recommends the Mediterranean diet as an eating plan to promote health and prevent disease  What Is the Mediterranean Diet?   Healthy eating plan based on typical foods and recipes of Mediterranean-style cooking  The diet is primarily a plant based diet; these foods should make up a majority of meals   Starches - Plant based foods should make up a majority of meals - They are an important sources of vitamins, minerals, energy, antioxidants, and fiber - Choose whole grains, foods high in fiber and minimally processed items  - Typical grain sources include wheat, oats, barley, corn, brown rice, bulgar, farro, millet, polenta, couscous  - Various types of beans include chickpeas, lentils, fava beans, black beans, white beans  Fruits  Veggies - Large quantities of antioxidant rich fruits & veggies; 6 or more servings  - Vegetables can be eaten raw or lightly drizzled with oil and cooked  - Vegetables common to the traditional Mediterranean Diet include: artichokes, arugula, beets, broccoli, brussel sprouts, cabbage, carrots, celery, collard greens, cucumbers, eggplant, kale, leeks, lemons, lettuce, mushrooms, okra, onions, peas, peppers, potatoes, pumpkin, radishes, rutabaga, shallots, spinach, sweet potatoes, turnips, zucchini - Fruits common to the Mediterranean Diet include: apples, apricots, avocados, cherries, clementines, dates, figs, grapefruits, grapes, melons, nectarines, oranges, peaches, pears, pomegranates, strawberries, tangerines  Fats - Replace butter and margarine with healthy oils, such as olive oil, canola oil, and tahini  - Limit nuts  to no more than a handful a day  - Nuts include walnuts, almonds, pecans, pistachios, pine nuts  - Limit or avoid candied, honey roasted or heavily salted nuts - Olives are central to the [employer]iterranean diet - can be eaten whole or used in a variety of dishes   Meats Protein - Limiting red meat: no more than a few times a month - When eating red meat: choose lean cuts and keep the portion to the size of deck of cards - Eggs: approx. 0 to 4 times a week  - Fish and lean poultry: at least 2 a week  - Healthy protein sources include, chicken, [place], lean beef, lamb - Increase intake of seafood such as tuna, salmon, trout, mackerel, shrimp, scallops - Avoid or limit high fat processed meats such as sausage and bacon  Dairy - Include moderate amounts of low fat dairy products  - Focus on healthy dairy such as fat free yogurt, skim milk, low or reduced fat cheese - Limit dairy products higher in fat such as whole or 2% milk, cheese, ice cream  Alcohol - Moderate amounts of red wine is ok  - No more than 5 oz daily for women (all ages) and men older than age 27  - No more than 10 oz of wine daily for men younger than 1565  Other - Limit sweets and other desserts  - Use herbs and spices instead of salt to flavor foods  - Herbs and spices common to the traditional Mediterranean Diet include: basil, bay leaves, chives, cloves, cumin, fennel, garlic, lavender, marjoram, mint, oregano, parsley, pepper, rosemary, sage, savory, sumac, tarragon, thyme   Its not just a diet, its a lifestyle:   The Mediterranean diet includes lifestyle factors typical of those in the region   Foods, drinks and meals are best eaten with others and savored  Daily physical activity is important for overall good health  This could be strenuous exercise like running and aerobics  This could also be more leisurely activities such as walking, housework, yard-work, or taking the stairs  Moderation is the key; a balanced and  healthy diet accommodates most foods and drinks  Consider portion sizes and frequency of consumption of certain foods   Meal Ideas & Options:   Breakfast:  o Whole wheat toast or whole wheat English muffins with peanut butter & hard boiled egg o Steel cut oats topped with apples & cinnamon and skim milk  o Fresh fruit: banana, strawberries, melon, berries, peaches  o Smoothies: strawberries, bananas, greek yogurt, peanut butter o Low fat greek yogurt with blueberries and granola  o Egg white omelet with spinach and mushrooms o Breakfast couscous: whole wheat couscous, apricots, skim milk, cranberries   Sandwiches:  o Hummus and grilled vegetables (peppers, zucchini, squash) on  whole wheat bread   o Grilled chicken on whole wheat pita with lettuce, tomatoes, cucumbers or tzatziki  o Tuna salad on whole wheat bread: tuna salad made with greek yogurt, olives, red peppers, capers, green onions o Garlic rosemary lamb pita: lamb sauted with garlic, rosemary, salt & pepper; add lettuce, cucumber, greek yogurt to pita - flavor with lemon juice and black pepper   Seafood:  o Mediterranean grilled salmon, seasoned with garlic, basil, parsley, lemon juice and black pepper o Shrimp, lemon, and spinach whole-grain pasta salad made with low fat greek yogurt  o Seared scallops with lemon orzo  o Seared tuna steaks seasoned salt, pepper, coriander topped with tomato mixture of olives, tomatoes, olive oil, minced garlic, parsley, green onions and cappers   Meats:  o Herbed greek chicken salad with kalamata olives, cucumber, feta  o Red bell peppers stuffed with spinach, bulgur, lean ground beef (or lentils) & topped with feta   o Kebabs: skewers of chicken, tomatoes, onions, zucchini, squash  o [place] burgers: made with red onions, mint, dill, lemon juice, feta cheese topped with roasted red peppers  Vegetarian o Cucumber salad: cucumbers, artichoke hearts, celery, red onion, feta cheese, tossed in  olive oil & lemon juice  o Hummus and whole grain pita points with a greek salad (lettuce, tomato, feta, olives, cucumbers, red onion) o Lentil soup with celery, carrots made with vegetable broth, garlic, salt and pepper  o Tabouli salad: parsley, bulgur, mint, scallions, cucumbers, tomato, radishes, lemon juice, olive oil, salt and pepper.       Fat and Cholesterol Restricted Eating Plan Eating a diet that limits fat and cholesterol may help lower your risk for heart disease and other conditions. Your body needs fat and cholesterol for basic functions, but eating too much of these things can be harmful to your health. Your health care provider may order lab tests to check your blood fat (lipid) and cholesterol levels. This helps your health care provider understand your risk for certain conditions and whether you need to make diet changes. Work with your health care provider or dietitian to make an eating plan that is right for you. Your plan includes:  Limit your fat intake to ______% or less of your total calories a day.  Limit your saturated fat intake to ______% or less of your total calories a day.  Limit the amount of cholesterol in your diet to less than _________mg a day.  Eat ___________ g of fiber a day. What are tips for following this plan? General guidelines   If you are overweight, work with your health care provider to lose weight safely. Losing just 5-10% of your body weight can improve your overall health and help prevent diseases such as diabetes and heart disease.  Avoid: ? Foods with added sugar. ? Fried foods. ? Foods that contain partially hydrogenated oils, including stick margarine, some tub margarines, cookies, crackers, and other baked goods.  Limit alcohol intake to no more than 1 drink a day for nonpregnant women and 2 drinks a day for men. One drink equals 12 oz of beer, 5 oz of wine, or 1 oz of hard liquor. Reading food labels  Check food labels  for: ? Trans fats, partially hydrogenated oils, or high amounts of saturated fat. Avoid foods that contain saturated fat and trans fat. ? The amount of cholesterol in each serving. Try to eat no more than 200 mg of cholesterol each day. ? The amount of fiber in  each serving. Try to eat at least 20-30 g of fiber each day.  Choose foods with healthy fats, such as: ? Monounsaturated and polyunsaturated fats. These include olive and canola oil, flaxseeds, walnuts, almonds, and seeds. ? Omega-3 fats. These are found in foods such as salmon, mackerel, sardines, tuna, flaxseed oil, and ground flaxseeds.  Choose grain products that have whole grains. Look for the word "whole" as the first word in the ingredient list. Cooking  Cook foods using methods other than frying. Baking, boiling, grilling, and broiling are some healthy options.  Eat more home-cooked food and less restaurant, buffet, and fast food.  Avoid cooking using saturated fats. ? Animal sources of saturated fats include meats, butter, and cream. ? Plant sources of saturated fats include palm oil, palm kernel oil, and coconut oil. Meal planning   At meals, imagine dividing your plate into fourths: ? Fill one-half of your plate with vegetables and green salads. ? Fill one-fourth of your plate with whole grains. ? Fill one-fourth of your plate with lean protein foods.  Eat fish that is high in omega-3 fats at least two times a week.  Eat more foods that contain fiber, such as whole grains, beans, apples, broccoli, carrots, peas, and barley. These foods help promote healthy cholesterol levels in the blood. Recommended foods Grains  Whole grains, such as whole wheat or whole grain breads, crackers, cereals, and pasta. Unsweetened oatmeal, bulgur, barley, quinoa, or brown rice. Corn or whole wheat flour tortillas. Vegetables  Fresh or frozen vegetables (raw, steamed, roasted, or grilled). Green salads. Fruits  All fresh, canned  (in natural juice), or frozen fruits. Meats and other protein foods  Ground beef (85% or leaner), grass-fed beef, or beef trimmed of fat. Skinless chicken or [place]. Ground chicken or [place]. Pork trimmed of fat. All fish and seafood. Egg whites. Dried beans, peas, or lentils. Unsalted nuts or seeds. Unsalted canned beans. Natural nut butters without added sugar and oil. Dairy  Low-fat or nonfat dairy products, such as skim or 1% milk, 2% or reduced-fat cheeses, low-fat and fat-free ricotta or cottage cheese, or plain low-fat and nonfat yogurt. Fats and oils  Tub margarine without trans fats. Light or reduced-fat mayonnaise and salad dressings. Avocado. Olive, canola, sesame, or safflower oils. The items listed above may not be a complete list of recommended foods or beverages. Contact your dietitian for more options. Foods to avoid Grains  White bread. White pasta. White rice. Cornbread. Bagels, pastries, and croissants. Crackers and snack foods that contain trans fat and hydrogenated oils. Vegetables  Vegetables cooked in cheese, cream, or butter sauce. Fried vegetables. Fruits  Canned fruit in heavy syrup. Fruit in cream or butter sauce. Fried fruit. Meats and other protein foods  Fatty cuts of meat. Ribs, chicken wings, bacon, sausage, bologna, salami, chitterlings, fatback, hot dogs, bratwurst, and packaged lunch meats. Liver and organ meats. Whole eggs and egg yolks. Chicken and [place] with skin. Fried meat. Dairy  Whole or 2% milk, cream, half-and-half, and cream cheese. Whole milk cheeses. Whole-fat or sweetened yogurt. Full-fat cheeses. Nondairy creamers and whipped toppings. Processed cheese, cheese spreads, and cheese curds. Beverages  Alcohol. Sugar-sweetened drinks such as sodas, lemonade, and fruit drinks. Fats and oils  Butter, stick margarine, lard, shortening, ghee, or bacon fat. Coconut, palm kernel, and palm oils. Sweets and desserts  Corn syrup, sugars, honey,  and molasses. Candy. Jam and jelly. Syrup. Sweetened cereals. Cookies, pies, cakes, donuts, muffins, and ice cream. The items listed above  may not be a complete list of foods and beverages to avoid. Contact your dietitian for more information. Summary  Your body needs fat and cholesterol for basic functions. However, eating too much of these things can be harmful to your health.  Work with your health care provider and dietitian to follow a diet low in fat and cholesterol. Doing this may help lower your risk for heart disease and other conditions.  Choose healthy fats, such as monounsaturated and polyunsaturated fats, and foods high in omega-3 fatty acids.  Eat fiber-rich foods, such as whole grains, beans, peas, fruits, and vegetables.  Limit or avoid alcohol, fried foods, and foods high in saturated fats, partially hydrogenated oils, and sugar. This information is not intended to replace advice given to you by your health care provider. Make sure you discuss any questions you have with your health care provider. Document Released: 06/27/2005 Document Revised: 06/09/2017 Document Reviewed: 03/14/2017 Elsevier Patient Education  2020 [employer].  American Heart Association (AHA) Exercise Recommendation  Being physically active is important to prevent heart disease and stroke, the nations No. 1and No. 5killers. To improve overall cardiovascular health, we suggest at least 150 minutes per week of moderate exercise or 75 minutes per week of vigorous exercise (or a combination of moderate and vigorous activity). Thirty minutes a day, five times a week is an easy goal to remember. You will also experience benefits even if you divide your time into two or three segments of 10 to 15 minutes per day.  For people who would benefit from lowering their blood pressure or cholesterol, we recommend 40 minutes of aerobic exercise of moderate to vigorous intensity three to four times a week to lower the  risk for heart attack and stroke.  Physical activity is anything that makes you move your body and burn calories.  This includes things like climbing stairs or playing sports. Aerobic exercises benefit your heart, and include walking, jogging, swimming or biking. Strength and stretching exercises are best for overall stamina and flexibility.  The simplest, positive change you can make to effectively improve your heart health is to start walking. It's enjoyable, free, easy, social and great exercise. A walking program is flexible and boasts high success rates because people can stick with it. It's easy for walking to become a regular and satisfying part of life.   For Overall Cardiovascular Health:  At least 30 minutes of moderate-intensity aerobic activity at least 5 days per week for a total of 150  OR   At least 25 minutes of vigorous aerobic activity at least 3 days per week for a total of 75 minutes; or a combination of moderate- and vigorous-intensity aerobic activity  AND   Moderate- to high-intensity muscle-strengthening activity at least 2 days per week for additional health benefits.  For Lowering Blood Pressure and Cholesterol  An average 40 minutes of moderate- to vigorous-intensity aerobic activity 3 or 4 times per week  What if I cant make it to the time goal? Something is always better than nothing! And everyone has to start somewhere. Even if you've been sedentary for years, today is the day you can begin to make healthy changes in your life. If you don't think you'll make it for 30 or 40 minutes, set a reachable goal for today. You can work up toward your overall goal by increasing your time as you get stronger. Don't let all-or-nothing thinking rob you of doing what you can every day.  Source:http://www.heart.org

## 2019-04-27 LAB — CBC WITH DIFFERENTIAL/PLATELET
Basophils Absolute: 0 10*3/uL (ref 0.0–0.2)
Basos: 0 %
EOS (ABSOLUTE): 0.2 10*3/uL (ref 0.0–0.4)
Eos: 2 %
Hematocrit: 42.1 % (ref 37.5–51.0)
Hemoglobin: 13.9 g/dL (ref 13.0–17.7)
Immature Grans (Abs): 0.1 10*3/uL (ref 0.0–0.1)
Immature Granulocytes: 1 %
Lymphocytes Absolute: 1.9 10*3/uL (ref 0.7–3.1)
Lymphs: 22 %
MCH: 27 pg (ref 26.6–33.0)
MCHC: 33 g/dL (ref 31.5–35.7)
MCV: 82 fL (ref 79–97)
Monocytes Absolute: 0.5 10*3/uL (ref 0.1–0.9)
Monocytes: 6 %
Neutrophils Absolute: 5.9 10*3/uL (ref 1.4–7.0)
Neutrophils: 69 %
Platelets: 406 10*3/uL (ref 150–450)
RBC: 5.15 x10E6/uL (ref 4.14–5.80)
RDW: 13.8 % (ref 11.6–15.4)
WBC: 8.6 10*3/uL (ref 3.4–10.8)

## 2019-04-27 LAB — COMPREHENSIVE METABOLIC PANEL
ALT: 23 IU/L (ref 0–44)
AST: 20 IU/L (ref 0–40)
Albumin/Globulin Ratio: 1.5 (ref 1.2–2.2)
Albumin: 4.7 g/dL (ref 4.1–5.2)
Alkaline Phosphatase: 129 IU/L — ABNORMAL HIGH (ref 39–117)
BUN/Creatinine Ratio: 14 (ref 9–20)
BUN: 13 mg/dL (ref 6–20)
Bilirubin Total: 0.3 mg/dL (ref 0.0–1.2)
CO2: 20 mmol/L (ref 20–29)
Calcium: 9.6 mg/dL (ref 8.7–10.2)
Chloride: 102 mmol/L (ref 96–106)
Creatinine, Ser: 0.92 mg/dL (ref 0.76–1.27)
GFR calc Af Amer: 132 mL/min/{1.73_m2} (ref >59)
GFR calc non Af Amer: 114 mL/min/{1.73_m2} (ref >59)
Globulin, Total: 3.1 g/dL (ref 1.5–4.5)
Glucose: 80 mg/dL (ref 65–99)
Potassium: 4.2 mmol/L (ref 3.5–5.2)
Sodium: 142 mmol/L (ref 134–144)
Total Protein: 7.8 g/dL (ref 6.0–8.5)

## 2019-04-27 LAB — LIPID PANEL
Chol/HDL Ratio: 5.7 ratio — ABNORMAL HIGH (ref 0.0–5.0)
Cholesterol, Total: 205 mg/dL — ABNORMAL HIGH (ref 100–199)
HDL: 36 mg/dL — ABNORMAL LOW (ref >39)
LDL Chol Calc (NIH): 101 mg/dL — ABNORMAL HIGH (ref 0–99)
Triglycerides: 400 mg/dL — ABNORMAL HIGH (ref 0–149)
VLDL Cholesterol Cal: 68 mg/dL — ABNORMAL HIGH (ref 5–40)

## 2019-04-27 LAB — TSH: TSH: 1.27 u[IU]/mL (ref 0.450–4.500)

## 2019-04-27 LAB — HIV ANTIBODY (ROUTINE TESTING W REFLEX): HIV Screen 4th Generation wRfx: NONREACTIVE

## 2019-04-27 LAB — RPR: RPR Ser Ql: NONREACTIVE

## 2019-04-27 LAB — HEMOGLOBIN A1C
Est. average glucose Bld gHb Est-mCnc: 117 mg/dL
Hgb A1c MFr Bld: 5.7 % — ABNORMAL HIGH (ref 4.8–5.6)

## 2019-05-01 ENCOUNTER — Encounter: Payer: Self-pay | Admitting: Registered Nurse

## 2019-05-01 ENCOUNTER — Other Ambulatory Visit: Payer: Self-pay | Admitting: Registered Nurse

## 2019-05-01 DIAGNOSIS — J302 Other seasonal allergic rhinitis: Secondary | ICD-10-CM

## 2019-05-01 MED ORDER — CETIRIZINE HCL 10 MG PO TABS: 10.0000 mg | ORAL_TABLET | Freq: Every day | ORAL | 11 refills | Status: DC

## 2019-05-02 LAB — URINE CYTOLOGY ANCILLARY ONLY
Chlamydia: NEGATIVE
Comment: NEGATIVE
Comment: NEGATIVE
Comment: NORMAL
Neisseria Gonorrhea: NEGATIVE
Trichomonas: NEGATIVE

## 2019-05-03 ENCOUNTER — Encounter: Payer: Self-pay | Admitting: Registered Nurse

## 2019-05-03 NOTE — Progress Notes (Signed)
Results in A1c at 5.7 Lipids mildly elevated  Encourage dietary control of these  Re check in 1 year  Letter sent to pt via [place], [place]

## 2019-06-18 DIAGNOSIS — Z20828 Contact with and (suspected) exposure to other viral communicable diseases: Secondary | ICD-10-CM | POA: Diagnosis not present

## 2019-09-18 ENCOUNTER — Encounter: Payer: Self-pay | Admitting: Registered Nurse

## 2019-09-18 ENCOUNTER — Other Ambulatory Visit: Payer: Self-pay

## 2019-09-18 ENCOUNTER — Ambulatory Visit (INDEPENDENT_AMBULATORY_CARE_PROVIDER_SITE_OTHER): Payer: BC Managed Care – PPO | Admitting: Registered Nurse

## 2019-09-18 VITALS — BP 146/87 | HR 88 | Temp 97.9°F | Ht 71.0 in | Wt 283.0 lb

## 2019-09-18 DIAGNOSIS — R21 Rash and other nonspecific skin eruption: Secondary | ICD-10-CM | POA: Diagnosis not present

## 2019-09-18 MED ORDER — PREDNISONE 10 MG PO TABS: ORAL_TABLET | ORAL | 0 refills | Status: AC

## 2019-09-18 MED ORDER — BETAMETHASONE VALERATE 0.1 % EX OINT: 1.0000 "application " | TOPICAL_OINTMENT | Freq: Two times a day (BID) | CUTANEOUS | 0 refills | Status: DC
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
## 2019-09-18 NOTE — Patient Instructions (Signed)
° ° ° °  If you have lab work done today you will be contacted with your lab results within the next 2 weeks.  If you have not heard from us then please contact us. The fastest way to get your results is to register for My Chart. ° ° °IF you received an x-ray today, you will receive an invoice from [facility]. Please contact [facility] at [phone] with questions or concerns regarding your invoice.  ° °IF you received labwork today, you will receive an invoice from [facility]. Please contact [facility] at [phone] with questions or concerns regarding your invoice.  ° °Our billing staff will not be able to assist you with questions regarding bills from these companies. ° °You will be contacted with the lab results as soon as they are available. The fastest way to get your results is to activate your My Chart account. Instructions are located on the last page of this paperwork. If you have not heard from us regarding the results in 2 weeks, please contact this office. °  ° ° ° °

## 2019-09-19 ENCOUNTER — Other Ambulatory Visit: Payer: Self-pay | Admitting: Registered Nurse

## 2019-09-19 DIAGNOSIS — M62838 Other muscle spasm: Secondary | ICD-10-CM

## 2019-09-19 MED ORDER — METHOCARBAMOL 500 MG PO TABS: 500.0000 mg | ORAL_TABLET | Freq: Four times a day (QID) | ORAL | 0 refills | Status: DC

## 2019-09-24 ENCOUNTER — Other Ambulatory Visit: Payer: Self-pay

## 2019-09-24 ENCOUNTER — Ambulatory Visit (INDEPENDENT_AMBULATORY_CARE_PROVIDER_SITE_OTHER): Payer: BC Managed Care – PPO | Admitting: Registered Nurse

## 2019-09-24 ENCOUNTER — Other Ambulatory Visit (HOSPITAL_COMMUNITY)
Admission: RE | Admit: 2019-09-24 | Discharge: 2019-09-24 | Disposition: A | Discharge status: Home / Self Care | Payer: BC Managed Care – PPO | Source: Ambulatory Visit | Attending: Registered Nurse | Admitting: Registered Nurse

## 2019-09-24 ENCOUNTER — Encounter: Payer: Self-pay | Admitting: Registered Nurse

## 2019-09-24 VITALS — BP 114/89 | HR 83 | Temp 97.0°F | Ht 71.0 in | Wt 278.0 lb

## 2019-09-24 DIAGNOSIS — R319 Hematuria, unspecified: Secondary | ICD-10-CM

## 2019-09-24 DIAGNOSIS — R7309 Other abnormal glucose: Secondary | ICD-10-CM

## 2019-09-24 DIAGNOSIS — E782 Mixed hyperlipidemia: Secondary | ICD-10-CM

## 2019-09-24 LAB — POCT URINALYSIS DIP (MANUAL ENTRY)
Bilirubin, UA: NEGATIVE
Glucose, UA: NEGATIVE mg/dL
Ketones, POC UA: NEGATIVE mg/dL
Leukocytes, UA: NEGATIVE
Nitrite, UA: NEGATIVE
Spec Grav, UA: >=1.03 — AB (ref 1.010–1.025)
Urobilinogen, UA: 0.2 E.U./dL
pH, UA: 6 (ref 5.0–8.0)

## 2019-09-24 LAB — COMPREHENSIVE METABOLIC PANEL
ALT: 29 IU/L (ref 0–44)
AST: 17 IU/L (ref 0–40)
Albumin/Globulin Ratio: 1.5 (ref 1.2–2.2)
Albumin: 4.5 g/dL (ref 4.1–5.2)
Alkaline Phosphatase: 115 IU/L (ref 39–117)
BUN/Creatinine Ratio: 25 — ABNORMAL HIGH (ref 9–20)
BUN: 20 mg/dL (ref 6–20)
Bilirubin Total: 0.3 mg/dL (ref 0.0–1.2)
CO2: 22 mmol/L (ref 20–29)
Calcium: 9.3 mg/dL (ref 8.7–10.2)
Chloride: 101 mmol/L (ref 96–106)
Creatinine, Ser: 0.79 mg/dL (ref 0.76–1.27)
GFR calc Af Amer: 142 mL/min/{1.73_m2} (ref >59)
GFR calc non Af Amer: 123 mL/min/{1.73_m2} (ref >59)
Globulin, Total: 3 g/dL (ref 1.5–4.5)
Glucose: 83 mg/dL (ref 65–99)
Potassium: 4.1 mmol/L (ref 3.5–5.2)
Sodium: 138 mmol/L (ref 134–144)
Total Protein: 7.5 g/dL (ref 6.0–8.5)

## 2019-09-24 LAB — LIPID PANEL
Chol/HDL Ratio: 4.6 ratio (ref 0.0–5.0)
Cholesterol, Total: 188 mg/dL (ref 100–199)
HDL: 41 mg/dL (ref >39)
LDL Chol Calc (NIH): 120 mg/dL — ABNORMAL HIGH (ref 0–99)
Triglycerides: 152 mg/dL — ABNORMAL HIGH (ref 0–149)
VLDL Cholesterol Cal: 27 mg/dL (ref 5–40)

## 2019-09-24 LAB — HEMOGLOBIN A1C
Est. average glucose Bld gHb Est-mCnc: 117 mg/dL
Hgb A1c MFr Bld: 5.7 % — ABNORMAL HIGH (ref 4.8–5.6)

## 2019-09-24 LAB — URINALYSIS, MICROSCOPIC ONLY
Bacteria, UA: NONE SEEN
Casts: NONE SEEN /lpf
Epithelial Cells (non renal): NONE SEEN /hpf (ref 0–10)
WBC, UA: NONE SEEN /hpf (ref 0–5)

## 2019-09-24 NOTE — Patient Instructions (Signed)
° ° ° °  If you have lab work done today you will be contacted with your lab results within the next 2 weeks.  If you have not heard from us then please contact us. The fastest way to get your results is to register for My Chart. ° ° °IF you received an x-ray today, you will receive an invoice from [facility]. Please contact  Radiology at [phone] with questions or concerns regarding your invoice.  ° °IF you received labwork today, you will receive an invoice from [facility]. Please contact [facility] at [phone] with questions or concerns regarding your invoice.  ° °Our billing staff will not be able to assist you with questions regarding bills from these companies. ° °You will be contacted with the lab results as soon as they are available. The fastest way to get your results is to activate your My Chart account. Instructions are located on the last page of this paperwork. If you have not heard from us regarding the results in 2 weeks, please contact this office. °  ° ° ° °

## 2019-09-24 NOTE — Progress Notes (Signed)
.  bw

## 2019-09-24 NOTE — Progress Notes (Signed)
Established Patient Office Visit  Subjective:  Patient ID: Jackson Wallace, male    DOB: 23-Oct-1990  Age: 29 y.o. MRN: [id number]  CC:  Chief Complaint  Patient presents with  . Hypertension    patient states that he went to have a physical done at work but they stated that his blood pressure is high, and a trace of blood in his urine ,    HPI Jackson Wallace presents for hematuria.  Notes that he was completing labs through his employer and small amounts of blood were noted on his urinalysis. He is completely asymptomatic at this time. Some lower back pain of msk origin - even bilaterally, worse after long day of work better with rest, aching in quality. Additionally, reports that if he holds his urine for long periods, he may experience a stop and start in his stream Denies gross hematuria, flank pain, suprapubic pain, scrotal pain, pain in penis, discharge, foul odor from urine, STI exposure, hx of renal dysfunction, nocturnal lower back pain, family history of renal or other malignancies.   No further concerns at this time   Past Medical History:  Diagnosis Date  . Allergy     History reviewed. No pertinent surgical history.  Family History  Problem Relation Age of Onset  . Diabetes Maternal Grandmother     Social History   Socioeconomic History  . Marital status: Significant Other    Spouse name: Not on file  . Number of children: 0  . Years of education: Not on file  . Highest education level: Not on file  Occupational History  . Occupation: driver  Tobacco Use  . Smoking status: Never Smoker  . Smokeless tobacco: Never Used  Substance and Sexual Activity  . Alcohol use: No  . Drug use: No  . Sexual activity: Not Currently  Other Topics Concern  . Not on file  Social History Narrative  . Not on file   Social Determinants of Health   Financial Resource Strain: Low Risk   . Difficulty of Paying Living Expenses: Not hard at all    Food Insecurity: No Food Insecurity  . Worried About Programme researcher, broadcasting/film/video in the Last Year: Never true  . Ran Out of Food in the Last Year: Never true  Transportation Needs: No Transportation Needs  . Lack of Transportation (Medical): No  . Lack of Transportation (Non-Medical): No  Physical Activity: Insufficiently Active  . Days of Exercise per Week: 3 days  . Minutes of Exercise per Session: 30 min  Stress: No Stress Concern Present  . Feeling of Stress : Not at all  Social Connections: Unknown  . Frequency of Communication with Friends and Family: Three times a week  . Frequency of Social Gatherings with Friends and Family: Twice a week  . Attends Religious Services: Patient refused  . Active Member of Clubs or Organizations: Patient refused  . Attends Banker Meetings: Patient refused  . Marital Status: Living with partner  Intimate Partner Violence: Not At Risk  . Fear of Current or Ex-Partner: No  . Emotionally Abused: No  . Physically Abused: No  . Sexually Abused: No    Outpatient Medications Prior to Visit  Medication Sig Dispense Refill  . betamethasone valerate ointment (VALISONE) 0.1 % Apply 1 application topically 2 (two) times daily. 30 g 0  . cetirizine (ZYRTEC) 10 MG tablet Take 1 tablet (10 mg total) by mouth daily. 30 tablet 11  . methocarbamol (  ROBAXIN) 500 MG tablet Take 1 tablet (500 mg total) by mouth 4 (four) times daily. 90 tablet 0  . predniSONE (DELTASONE) 10 MG tablet Take 4 tablets (40 mg total) by mouth daily with breakfast for 3 days, THEN 2 tablets (20 mg total) daily with breakfast for 3 days, THEN 1 tablet (10 mg total) daily with breakfast for 3 days. 21 tablet 0   No facility-administered medications prior to visit.    Allergies  Allergen Reactions  . Shrimp [Shellfish Allergy]     ROS Review of Systems  Constitutional: Negative.   HENT: Negative.   Eyes: Negative.   Respiratory: Negative.   Cardiovascular: Negative.    Gastrointestinal: Negative.   Endocrine: Negative.   Genitourinary: Negative.   Musculoskeletal: Negative.   Skin: Negative.   Allergic/Immunologic: Negative.   Neurological: Negative.   Hematological: Negative.   Psychiatric/Behavioral: Negative.   All other systems reviewed and are negative.     Objective:    Physical Exam  Constitutional: He is oriented to person, place, and time. He appears well-developed and well-nourished. No distress.  Cardiovascular: Normal rate and regular rhythm.  Pulmonary/Chest: Effort normal. No respiratory distress.  Neurological: He is alert and oriented to person, place, and time.  Skin: Skin is warm and dry. No rash noted. He is not diaphoretic. No erythema. No pallor.  Psychiatric: He has a normal mood and affect. His behavior is normal. Judgment and thought content normal.  Nursing note and vitals reviewed.   BP 114/89   Pulse 83   Temp (!) 97 F (36.1 C) (Temporal)   Ht 5\' 11"  (1.803 m)   Wt 278 lb (126.1 kg)   SpO2 99%   BMI 38.77 kg/m  Wt Readings from Last 3 Encounters:  09/24/19 278 lb (126.1 kg)  09/18/19 283 lb (128.4 kg)  04/26/19 282 lb (127.9 kg)     There are no preventive care reminders to display for this patient.  There are no preventive care reminders to display for this patient.  Lab Results  Component Value Date   TSH 1.270 04/26/2019   Lab Results  Component Value Date   WBC 8.6 04/26/2019   HGB 13.9 04/26/2019   HCT 42.1 04/26/2019   MCV 82 04/26/2019   PLT 406 04/26/2019   Lab Results  Component Value Date   NA 142 04/26/2019   K 4.2 04/26/2019   CO2 20 04/26/2019   GLUCOSE 80 04/26/2019   BUN 13 04/26/2019   CREATININE 0.92 04/26/2019   BILITOT 0.3 04/26/2019   ALKPHOS 129 (H) 04/26/2019   AST 20 04/26/2019   ALT 23 04/26/2019   PROT 7.8 04/26/2019   ALBUMIN 4.7 04/26/2019   CALCIUM 9.6 04/26/2019   Lab Results  Component Value Date   CHOL 205 (H) 04/26/2019   Lab Results   Component Value Date   HDL 36 (L) 04/26/2019   Lab Results  Component Value Date   LDLCALC 101 (H) 04/26/2019   Lab Results  Component Value Date   TRIG 400 (H) 04/26/2019   Lab Results  Component Value Date   CHOLHDL 5.7 (H) 04/26/2019   Lab Results  Component Value Date   HGBA1C 5.7 (H) 04/26/2019      Assessment & Plan:   Problem List Items Addressed This Visit    None    Visit Diagnoses    Elevated hemoglobin A1c    -  Primary   Relevant Orders   Hemoglobin A1c   Comprehensive metabolic  panel   Elevated cholesterol with elevated triglycerides       Relevant Orders   Lipid panel   Hematuria, unspecified type       Relevant Orders   POCT urinalysis dipstick (Completed)   Urine Microscopic   Urine cytology ancillary only      No orders of the defined types were placed in this encounter.   Follow-up: Return in about 3 months (around 12/25/2019) for hematuria follow up.   PLAN  Discussed with him that in young patients without red flags for malignancy, microscopic hematuria is usually benign and transient. However, we will work up with labs at this time and possibly follow up with imaging, depending on lab results.  We will follow up with urinalysis in 3 mo as well  Patient encouraged to call clinic with any questions, comments, or concerns.   Janeece Agee, NP

## 2019-09-25 LAB — URINE CYTOLOGY ANCILLARY ONLY
Chlamydia: NEGATIVE
Comment: NEGATIVE
Comment: NEGATIVE
Comment: NORMAL
Neisseria Gonorrhea: NEGATIVE
Trichomonas: NEGATIVE

## 2019-09-27 ENCOUNTER — Encounter: Payer: Self-pay | Admitting: Registered Nurse

## 2019-10-04 ENCOUNTER — Encounter: Payer: Self-pay | Admitting: Registered Nurse

## 2019-10-04 ENCOUNTER — Other Ambulatory Visit: Payer: Self-pay

## 2019-10-04 ENCOUNTER — Ambulatory Visit: Payer: BC Managed Care – PPO | Admitting: Registered Nurse

## 2019-10-04 VITALS — BP 138/91 | HR 82 | Temp 97.3°F | Resp 17 | Ht 71.0 in | Wt 271.8 lb

## 2019-10-04 DIAGNOSIS — Z87448 Personal history of other diseases of urinary system: Secondary | ICD-10-CM

## 2019-10-04 NOTE — Progress Notes (Signed)
Established Patient Office Visit  Subjective:  Patient ID: Jackson Wallace, male    DOB: 06-26-1991  Age: 29 y.o. MRN: [id number]  CC:  Chief Complaint  Patient presents with  . Follow-up    patient needed a follow up for fhis blood pressure and also need copies of the lab work for the previous visit. no other concerns     HPI Jackson Wallace presents for bp check and lab review Discussed that urine micro did not show blood He continues to be asymptomatic and labs reassuring Return in 3-6 mos to recheck UA Reviewed red flags Provided documentation for work   Past Medical History:  Diagnosis Date  . Allergy     No past surgical history on file.  Family History  Problem Relation Age of Onset  . Diabetes Maternal Grandmother     Social History   Socioeconomic History  . Marital status: Significant Other    Spouse name: Not on file  . Number of children: 0  . Years of education: Not on file  . Highest education level: Not on file  Occupational History  . Occupation: driver  Tobacco Use  . Smoking status: Never Smoker  . Smokeless tobacco: Never Used  Substance and Sexual Activity  . Alcohol use: No  . Drug use: No  . Sexual activity: Not Currently  Other Topics Concern  . Not on file  Social History Narrative  . Not on file   Social Determinants of Health   Financial Resource Strain: Low Risk   . Difficulty of Paying Living Expenses: Not hard at all  Food Insecurity: No Food Insecurity  . Worried About Charity fundraiser in the Last Year: Never true  . Ran Out of Food in the Last Year: Never true  Transportation Needs: No Transportation Needs  . Lack of Transportation (Medical): No  . Lack of Transportation (Non-Medical): No  Physical Activity: Insufficiently Active  . Days of Exercise per Week: 3 days  . Minutes of Exercise per Session: 30 min  Stress: No Stress Concern Present  . Feeling of Stress : Not at all  Social  Connections: Unknown  . Frequency of Communication with Friends and Family: Three times a week  . Frequency of Social Gatherings with Friends and Family: Twice a week  . Attends Religious Services: Patient refused  . Active Member of Clubs or Organizations: Patient refused  . Attends Archivist Meetings: Patient refused  . Marital Status: Living with partner  Intimate Partner Violence: Not At Risk  . Fear of Current or Ex-Partner: No  . Emotionally Abused: No  . Physically Abused: No  . Sexually Abused: No    Outpatient Medications Prior to Visit  Medication Sig Dispense Refill  . betamethasone valerate ointment (VALISONE) 0.1 % Apply 1 application topically 2 (two) times daily. 30 g 0  . cetirizine (ZYRTEC) 10 MG tablet Take 1 tablet (10 mg total) by mouth daily. 30 tablet 11  . methocarbamol (ROBAXIN) 500 MG tablet Take 1 tablet (500 mg total) by mouth 4 (four) times daily. 90 tablet 0   No facility-administered medications prior to visit.    Allergies  Allergen Reactions  . Shrimp [Shellfish Allergy]     ROS Review of Systems  Constitutional: Negative.   HENT: Negative.   Eyes: Negative.   Respiratory: Negative.   Cardiovascular: Negative.   Gastrointestinal: Negative.   Endocrine: Negative.   Genitourinary: Negative.   Musculoskeletal: Negative.  Skin: Negative.   Allergic/Immunologic: Negative.   Neurological: Negative.   Hematological: Negative.   Psychiatric/Behavioral: Negative.   All other systems reviewed and are negative.     Objective:    Physical Exam  Constitutional: He is oriented to person, place, and time. He appears well-developed and well-nourished. No distress.  Cardiovascular: Normal rate and regular rhythm.  Pulmonary/Chest: Effort normal. No respiratory distress.  Neurological: He is oriented to person, place, and time.  Skin: Skin is warm and dry. No rash noted. He is not diaphoretic. No erythema. No pallor.  Psychiatric: He  has a normal mood and affect. His behavior is normal. Judgment and thought content normal.  Nursing note and vitals reviewed.   BP (!) 138/91   Pulse 82   Temp (!) 97.3 F (36.3 C) (Temporal)   Resp 17   Ht 5\' 11"  (1.803 m)   Wt 271 lb 12.8 oz (123.3 kg)   SpO2 100%   BMI 37.91 kg/m  Wt Readings from Last 3 Encounters:  10/04/19 271 lb 12.8 oz (123.3 kg)  09/24/19 278 lb (126.1 kg)  09/18/19 283 lb (128.4 kg)     There are no preventive care reminders to display for this patient.  There are no preventive care reminders to display for this patient.  Lab Results  Component Value Date   TSH 1.270 04/26/2019   Lab Results  Component Value Date   WBC 8.6 04/26/2019   HGB 13.9 04/26/2019   HCT 42.1 04/26/2019   MCV 82 04/26/2019   PLT 406 04/26/2019   Lab Results  Component Value Date   NA 138 09/24/2019   K 4.1 09/24/2019   CO2 22 09/24/2019   GLUCOSE 83 09/24/2019   BUN 20 09/24/2019   CREATININE 0.79 09/24/2019   BILITOT 0.3 09/24/2019   ALKPHOS 115 09/24/2019   AST 17 09/24/2019   ALT 29 09/24/2019   PROT 7.5 09/24/2019   ALBUMIN 4.5 09/24/2019   CALCIUM 9.3 09/24/2019   Lab Results  Component Value Date   CHOL 188 09/24/2019   Lab Results  Component Value Date   HDL 41 09/24/2019   Lab Results  Component Value Date   LDLCALC 120 (H) 09/24/2019   Lab Results  Component Value Date   TRIG 152 (H) 09/24/2019   Lab Results  Component Value Date   CHOLHDL 4.6 09/24/2019   Lab Results  Component Value Date   HGBA1C 5.7 (H) 09/24/2019      Assessment & Plan:   Problem List Items Addressed This Visit    None    Visit Diagnoses    History of hematuria    -  Primary      No orders of the defined types were placed in this encounter.   Follow-up: No follow-ups on file.   PLAN  BP still mildly elevated but patient reports he has lost some weight and continues to make lifestyle changes that will further this goal  Hematuria : likely  transient and idiopathic. Will continue to monitor, but no red flags at this time.  Patient encouraged to call clinic with any questions, comments, or concerns.  09/26/2019, NP

## 2019-10-04 NOTE — Patient Instructions (Signed)
° ° ° °  If you have lab work done today you will be contacted with your lab results within the next 2 weeks.  If you have not heard from us then please contact us. The fastest way to get your results is to register for My Chart. ° ° °IF you received an x-ray today, you will receive an invoice from [facility]. Please contact [facility] at [phone] with questions or concerns regarding your invoice.  ° °IF you received labwork today, you will receive an invoice from [facility]. Please contact [facility] at [phone] with questions or concerns regarding your invoice.  ° °Our billing staff will not be able to assist you with questions regarding bills from these companies. ° °You will be contacted with the lab results as soon as they are available. The fastest way to get your results is to activate your My Chart account. Instructions are located on the last page of this paperwork. If you have not heard from us regarding the results in 2 weeks, please contact this office. °  ° ° ° °

## 2019-12-24 ENCOUNTER — Ambulatory Visit: Payer: BC Managed Care – PPO | Admitting: Registered Nurse

## 2019-12-27 ENCOUNTER — Ambulatory Visit: Payer: BC Managed Care – PPO | Admitting: Registered Nurse

## 2019-12-30 ENCOUNTER — Encounter: Payer: Self-pay | Admitting: Registered Nurse

## 2020-04-28 ENCOUNTER — Encounter: Payer: BC Managed Care – PPO | Admitting: Registered Nurse

## 2020-05-17 ENCOUNTER — Other Ambulatory Visit: Payer: Self-pay | Admitting: Registered Nurse

## 2020-05-17 DIAGNOSIS — J302 Other seasonal allergic rhinitis: Secondary | ICD-10-CM

## 2020-07-15 ENCOUNTER — Telehealth (INDEPENDENT_AMBULATORY_CARE_PROVIDER_SITE_OTHER): Payer: 59 | Admitting: Registered Nurse

## 2020-07-15 ENCOUNTER — Ambulatory Visit: Payer: 59 | Admitting: Registered Nurse

## 2020-07-15 ENCOUNTER — Encounter: Payer: Self-pay | Admitting: Registered Nurse

## 2020-07-15 ENCOUNTER — Other Ambulatory Visit: Payer: Self-pay

## 2020-07-15 VITALS — Ht 70.0 in | Wt 280.0 lb

## 2020-07-15 DIAGNOSIS — U071 COVID-19: Secondary | ICD-10-CM

## 2020-07-15 DIAGNOSIS — R062 Wheezing: Secondary | ICD-10-CM | POA: Diagnosis not present

## 2020-07-15 DIAGNOSIS — R059 Cough, unspecified: Secondary | ICD-10-CM

## 2020-07-15 MED ORDER — BENZONATATE 200 MG PO CAPS: 200.0000 mg | ORAL_CAPSULE | Freq: Two times a day (BID) | ORAL | 0 refills | Status: DC | PRN

## 2020-07-15 MED ORDER — GUAIFENESIN-DM 100-10 MG/5ML PO SYRP: 5.0000 mL | ORAL_SOLUTION | ORAL | 0 refills | Status: DC | PRN

## 2020-07-15 MED ORDER — ALBUTEROL SULFATE HFA 108 (90 BASE) MCG/ACT IN AERS: 2.0000 | INHALATION_SPRAY | Freq: Four times a day (QID) | RESPIRATORY_TRACT | 0 refills | Status: DC | PRN

## 2020-07-15 NOTE — Patient Instructions (Signed)
° ° ° °  If you have lab work done today you will be contacted with your lab results within the next 2 weeks.  If you have not heard from us then please contact us. The fastest way to get your results is to register for My Chart. ° ° °IF you received an x-ray today, you will receive an invoice from [facility]. Please contact [facility] at [phone] with questions or concerns regarding your invoice.  ° °IF you received labwork today, you will receive an invoice from [facility]. Please contact [facility] at [phone] with questions or concerns regarding your invoice.  ° °Our billing staff will not be able to assist you with questions regarding bills from these companies. ° °You will be contacted with the lab results as soon as they are available. The fastest way to get your results is to activate your My Chart account. Instructions are located on the last page of this paperwork. If you have not heard from us regarding the results in 2 weeks, please contact this office. °  ° ° ° °

## 2020-07-15 NOTE — Progress Notes (Signed)
Telemedicine Encounter- SOAP NOTE Established Patient  This telephone encounter was conducted with the patient's (or proxy's) verbal consent via audio telecommunications: yes  Patient was instructed to have this encounter in a suitably private space; and to only have persons present to whom they give permission to participate. In addition, patient identity was confirmed by use of name plus two identifiers (DOB and address).  I discussed the limitations, risks, security and privacy concerns of performing an evaluation and management service by telephone and the availability of in person appointments. I also discussed with the patient that there may be a patient responsible charge related to this service. The patient expressed understanding and agreed to proceed.  I spent a total of 15 minutes talking with the patient or their proxy.  Patient at home Provider in office  Chief Complaint  Patient presents with  . wanting to get covid tested. pos for covid.    All sx started wed. 07/08/20 - HA -Dizzy  -Sourness  . Cold Exposure    2 co-workers tested pos on 07/08/20    Subjective   Jackson Wallace is a 30 y.o. established patient. Telephone visit today for cough  HPI Onset one week ago Taking OTC meds - vicks is working, mucinex, other cold and flu only doing some mild control. Took two ibuprofen on Saturday.  Feeling up and down - a lot of fatigue, mild dizziness Headaches - pressure, no neuro changes No feverish feeling, no sweats, no chills. Two coworkers tested positive on 07/08/20 Has not been vaccinated   Patient Active Problem List   Diagnosis Date Noted  . HPV vaccine counseling 12/20/2016  . Annual physical exam 12/20/2016    Past Medical History:  Diagnosis Date  . Allergy     Current Outpatient Medications  Medication Sig Dispense Refill  . albuterol (VENTOLIN HFA) 108 (90 Base) MCG/ACT inhaler Inhale 2 puffs into the lungs every 6 (six) hours  as needed for wheezing or shortness of breath. 8 g 0  . benzonatate (TESSALON) 200 MG capsule Take 1 capsule (200 mg total) by mouth 2 (two) times daily as needed for cough. 20 capsule 0  . guaiFENesin-dextromethorphan (ROBITUSSIN DM) 100-10 MG/5ML syrup Take 5 mLs by mouth every 4 (four) hours as needed for cough. 118 mL 0  . betamethasone valerate ointment (VALISONE) 0.1 % Apply 1 application topically 2 (two) times daily. 30 g 0  . cetirizine (ZYRTEC) 10 MG tablet TAKE 1 TABLET(10 MG) BY MOUTH DAILY 30 tablet 0  . methocarbamol (ROBAXIN) 500 MG tablet Take 1 tablet (500 mg total) by mouth 4 (four) times daily. 90 tablet 0   No current facility-administered medications for this visit.    Allergies  Allergen Reactions  . Shrimp [Shellfish Allergy]     Social History   Socioeconomic History  . Marital status: Significant Other    Spouse name: Not on file  . Number of children: 0  . Years of education: Not on file  . Highest education level: Not on file  Occupational History  . Occupation: driver  Tobacco Use  . Smoking status: Never Smoker  . Smokeless tobacco: Never Used  Vaping Use  . Vaping Use: Never used  Substance and Sexual Activity  . Alcohol use: No  . Drug use: No  . Sexual activity: Not Currently  Other Topics Concern  . Not on file  Social History Narrative  . Not on file   Social Determinants of Health  Financial Resource Strain: Not on file  Food Insecurity: Not on file  Transportation Needs: Not on file  Physical Activity: Not on file  Stress: Not on file  Social Connections: Not on file  Intimate Partner Violence: Not on file    Review of Systems  Constitutional: Positive for chills.  HENT: Negative.   Eyes: Negative.   Respiratory: Positive for cough and shortness of breath.   Cardiovascular: Negative.   Gastrointestinal: Negative.   Genitourinary: Negative.   Musculoskeletal: Positive for myalgias.  Skin: Negative.   Neurological:  Negative.   Endo/Heme/Allergies: Negative.   Psychiatric/Behavioral: Negative.     Objective   Vitals as reported by the patient: Today's Vitals   07/15/20 0835  Weight: 280 lb (127 kg)  Height: 5\' 10"  (1.778 m)    Jackson Wallace was seen today for wanting to get covid tested. pos for covid. and cold exposure.  Diagnoses and all orders for this visit:  COVID -     Novel Coronavirus, NAA ([facility])  Wheezing -     benzonatate (TESSALON) 200 MG capsule; Take 1 capsule (200 mg total) by mouth 2 (two) times daily as needed for cough. -     guaiFENesin-dextromethorphan (ROBITUSSIN DM) 100-10 MG/5ML syrup; Take 5 mLs by mouth every 4 (four) hours as needed for cough. -     albuterol (VENTOLIN HFA) 108 (90 Base) MCG/ACT inhaler; Inhale 2 puffs into the lungs every 6 (six) hours as needed for wheezing or shortness of breath.  Cough -     benzonatate (TESSALON) 200 MG capsule; Take 1 capsule (200 mg total) by mouth 2 (two) times daily as needed for cough. -     guaiFENesin-dextromethorphan (ROBITUSSIN DM) 100-10 MG/5ML syrup; Take 5 mLs by mouth every 4 (four) hours as needed for cough. -     albuterol (VENTOLIN HFA) 108 (90 Base) MCG/ACT inhaler; Inhale 2 puffs into the lungs every 6 (six) hours as needed for wheezing or shortness of breath.   PLAN  covid drive up testing - pt will present early afternoon  Isolate until symptoms improve  Symptom relief sent as above  Discussed return precautions and ER precautions  Patient encouraged to call clinic with any questions, comments, or concerns.  I discussed the assessment and treatment plan with the patient. The patient was provided an opportunity to ask questions and all were answered. The patient agreed with the plan and demonstrated an understanding of the instructions.   The patient was advised to call back or seek an in-person evaluation if the symptoms worsen or if the condition fails to improve as anticipated.  I provided 15  minutes of non-face-to-face time during this encounter.  Christiane Ha, NP  Primary Care at [facility]

## 2020-07-17 ENCOUNTER — Encounter: Payer: Self-pay | Admitting: Registered Nurse

## 2020-07-17 LAB — SARS-COV-2, NAA 2 DAY TAT

## 2020-07-17 LAB — NOVEL CORONAVIRUS, NAA: SARS-CoV-2, NAA: NOT DETECTED

## 2020-08-11 ENCOUNTER — Other Ambulatory Visit: Payer: Self-pay | Admitting: Registered Nurse

## 2020-08-11 DIAGNOSIS — J302 Other seasonal allergic rhinitis: Secondary | ICD-10-CM

## 2020-10-23 ENCOUNTER — Emergency Department (HOSPITAL_COMMUNITY)
Admission: EM | Admit: 2020-10-23 | Discharge: 2020-10-23 | Disposition: A | Discharge status: Home / Self Care | Payer: 59 | Attending: Emergency Medicine | Admitting: Emergency Medicine

## 2020-10-23 ENCOUNTER — Encounter (HOSPITAL_COMMUNITY): Payer: Self-pay

## 2020-10-23 ENCOUNTER — Emergency Department (HOSPITAL_COMMUNITY): Payer: 59

## 2020-10-23 DIAGNOSIS — Z7982 Long term (current) use of aspirin: Secondary | ICD-10-CM | POA: Diagnosis not present

## 2020-10-23 DIAGNOSIS — I951 Orthostatic hypotension: Secondary | ICD-10-CM | POA: Insufficient documentation

## 2020-10-23 DIAGNOSIS — R42 Dizziness and giddiness: Secondary | ICD-10-CM

## 2020-10-23 LAB — CBC
HCT: 40 % (ref 39.0–52.0)
Hemoglobin: 13.2 g/dL (ref 13.0–17.0)
MCH: 26.6 pg (ref 26.0–34.0)
MCHC: 33 g/dL (ref 30.0–36.0)
MCV: 80.6 fL (ref 80.0–100.0)
Platelets: 340 10*3/uL (ref 150–400)
RBC: 4.96 MIL/uL (ref 4.22–5.81)
RDW: 13.5 % (ref 11.5–15.5)
WBC: 8.7 10*3/uL (ref 4.0–10.5)
nRBC: 0 % (ref 0.0–0.2)

## 2020-10-23 LAB — BASIC METABOLIC PANEL
Anion gap: 9 (ref 5–15)
BUN: 16 mg/dL (ref 6–20)
CO2: 25 mmol/L (ref 22–32)
Calcium: 9.1 mg/dL (ref 8.9–10.3)
Chloride: 101 mmol/L (ref 98–111)
Creatinine, Ser: 0.73 mg/dL (ref 0.61–1.24)
GFR, Estimated: >60 mL/min (ref >60)
Glucose, Bld: 99 mg/dL (ref 70–99)
Potassium: 3.6 mmol/L (ref 3.5–5.1)
Sodium: 135 mmol/L (ref 135–145)

## 2020-10-23 LAB — TROPONIN I (HIGH SENSITIVITY): Troponin I (High Sensitivity): <2 ng/L (ref <18)

## 2020-10-23 MED ORDER — SODIUM CHLORIDE 0.9 % IV BOLUS
1000.0000 mL | Freq: Once | INTRAVENOUS | Status: AC
  Administered 2020-10-23: 1000 mL via INTRAVENOUS

## 2020-10-23 NOTE — Discharge Instructions (Signed)
Home to rest and hydrate, recheck with your doctor if symptoms continue.  Monitor your blood pressure, record readings and take to your doctor for follow-up.  To emergency room for new or worsening symptoms.

## 2020-10-23 NOTE — ED Triage Notes (Signed)
Pt arrived via walk in c/o chest pain, SOB and dizziness that started while at work this afternoon.

## 2020-10-23 NOTE — ED Provider Notes (Signed)
[facility]-EMERGENCY DEPT Provider Note   CSN: [id number] Arrival date & time: 10/23/20  1042     History Chief Complaint  Patient presents with  . Chest Pain    Jackson Wallace is a 30 y.o. male.  30 year old male presents with complaint of feeling lightheaded and dizzy today.  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  Symptoms resolved while resting however upon standing felt lightheaded again which prompted him to come to the emergency room.  Patient states that he had a pins and needle sensation on the right side of his chest which has since resolved.  History of hypertension, not currently medicated, PCP is monitoring.  Also taking antibiotics for left otitis externa but denies vomiting or diarrhea.  No lower extremity edema, no other complaints or concerns.  Patient did eat a granola bar for breakfast today.        Past Medical History:  Diagnosis Date  . Allergy     Patient Active Problem List   Diagnosis Date Noted  . HPV vaccine counseling 12/20/2016  . Annual physical exam 12/20/2016    History reviewed. No pertinent surgical history.     Family History  Problem Relation Age of Onset  . Diabetes Maternal Grandmother     Social History   Tobacco Use  . Smoking status: Never Smoker  . Smokeless tobacco: Never Used  Vaping Use  . Vaping Use: Never used  Substance Use Topics  . Alcohol use: No  . Drug use: No    Home Medications Prior to Admission medications   Medication Sig Start Date End Date Taking? Authorizing Provider  amoxicillin-clavulanate (AUGMENTIN) 875-125 MG tablet Take 1 tablet by mouth 2 (two) times daily. 10/17/20  Yes [provider]  aspirin-acetaminophen-caffeine (EXCEDRIN MIGRAINE) [phone] MG tablet Take 1-2 tablets by mouth every 6 (six) hours as needed  for headache.   Yes [provider]  neomycin-polymyxin-hydrocortisone (CORTISPORIN) 3.5-10000-1 OTIC suspension Place 4 drops into both ears daily. 10/05/20  Yes [provider]  albuterol (VENTOLIN HFA) 108 (90 Base) MCG/ACT inhaler Inhale 2 puffs into the lungs every 6 (six) hours as needed for wheezing or shortness of breath. Patient not taking: Reported on 10/23/2020 07/15/20   Janeece Agee, NP  benzonatate (TESSALON) 200 MG capsule Take 1 capsule (200 mg total) by mouth 2 (two) times daily as needed for cough. Patient not taking: Reported on 10/23/2020 07/15/20   Janeece Agee, NP  cetirizine (ZYRTEC) 10 MG tablet TAKE 1 TABLET(10 MG) BY MOUTH DAILY Patient not taking: Reported on 10/23/2020 08/11/20   Janeece Agee, NP  CIPRODEX OTIC suspension Place 4 drops into both ears 2 (two) times daily. 10/17/20   [provider]  guaiFENesin-dextromethorphan (ROBITUSSIN DM) 100-10 MG/5ML syrup Take 5 mLs by mouth every 4 (four) hours as needed for cough. Patient not taking: Reported on 10/23/2020 07/15/20   Janeece Agee, NP    Allergies    Shrimp [shellfish allergy]  Review of Systems   Review of Systems  Constitutional: Positive for diaphoresis. Negative for chills and fever.  HENT: Positive for ear pain. Negative for ear discharge.   Eyes: Negative for visual disturbance.  Respiratory: Negative for shortness of breath.   Cardiovascular: Positive for chest pain. Negative for palpitations and leg swelling.  Gastrointestinal: Negative for abdominal pain,  diarrhea, nausea and vomiting.  Genitourinary: Negative for difficulty urinating and dysuria.  Musculoskeletal: Negative for arthralgias and myalgias.  Skin: Negative for rash and wound.  Allergic/Immunologic: Negative for immunocompromised state.  Neurological: Positive for dizziness and light-headedness. Negative for speech difficulty and weakness.  Hematological: Does not bruise/bleed easily.   Psychiatric/Behavioral: Negative for confusion.  All other systems reviewed and are negative.   Physical Exam Updated Vital Signs BP (!) 144/89   Pulse 86   Temp 98.1 F (36.7 C) (Oral)   Resp 20   Ht 5\' 9"  (1.753 m)   Wt 132.5 kg   SpO2 99%   BMI 43.12 kg/m   Physical Exam Vitals and nursing note reviewed.  Constitutional:      General: He is not in acute distress.    Appearance: He is well-developed. He is not diaphoretic.  HENT:     Head: Normocephalic and atraumatic.     Right Ear: Tympanic membrane and ear canal normal.     Left Ear: Tympanic membrane and ear canal normal.  Cardiovascular:     Rate and Rhythm: Normal rate and regular rhythm.     Heart sounds: Normal heart sounds. No murmur heard.   Pulmonary:     Effort: Pulmonary effort is normal.     Breath sounds: Normal breath sounds.  Chest:     Chest wall: No tenderness.  Abdominal:     Palpations: Abdomen is soft.     Tenderness: There is no abdominal tenderness.  Musculoskeletal:     Cervical back: Neck supple.     Right lower leg: No edema.     Left lower leg: No edema.  Skin:    General: Skin is warm and dry.  Neurological:     Mental Status: He is alert and oriented to person, place, and time.  Psychiatric:        Behavior: Behavior normal.     ED Results / Procedures / Treatments   Labs (all labs ordered are listed, but only abnormal results are displayed) Labs Reviewed  BASIC METABOLIC PANEL  CBC  TROPONIN I (HIGH SENSITIVITY)    EKG None  Radiology DG Chest 2 View  Result Date: 10/23/2020 CLINICAL DATA:  30 y.o male arrived via walk in c/o chest pain, SOB and dizziness that started while at work this afternoon. EXAM: CHEST - 2 VIEW COMPARISON:  None. FINDINGS: The heart size and mediastinal contours are within normal limits. Both lungs are clear. No pleural effusion or pneumothorax. The visualized skeletal structures are unremarkable. IMPRESSION: Normal chest radiographs.  Electronically Signed   By: 26 M.D.   On: 10/23/2020 11:39    Procedures Procedures   Medications Ordered in ED Medications  sodium chloride 0.9 % bolus 1,000 mL (1,000 mLs Intravenous New Bag/Given 10/23/20 1239)    ED Course  I have reviewed the triage vital signs and the nursing notes.  Pertinent labs & imaging results that were available during my care of the patient were reviewed by me and considered in my medical decision making (see chart for details).  Clinical Course as of 10/23/20 1320  Fri Oct 23, 2020  [id number] 30 year old male with complaint of feeling lightheaded and dizzy with pins and needle sensation to the right side of the chest today.  Symptoms were better with rest and then returned with standing again.  On evaluation, patient is well-appearing, nontoxic.  Lungs clear to auscultation, heart regular rate and rhythm.  Neuro exam unremarkable. No chest wall  tenderness. Labs reassuring including CBC, BMP, troponin.  Patient is questionably orthostatic with increase in heart rate from lying to standing, was given IV fluids and symptoms have resolved. Advised to follow-up with his doctor for recheck, continue to monitor his blood pressure at home, return to ED for new or worsening symptoms. [LM]    Clinical Course User Index [LM] Alden Hipp   MDM Rules/Calculators/A&P                          Final Clinical Impression(s) / ED Diagnoses Final diagnoses:  Orthostatic dizziness    Rx / DC Orders ED Discharge Orders    None       Jeannie Fend, PA-C 10/23/20 1320    Vanetta Mulders, MD 10/23/20 1719

## 2020-11-03 ENCOUNTER — Encounter: Payer: Self-pay | Admitting: Registered Nurse

## 2020-11-03 ENCOUNTER — Ambulatory Visit (INDEPENDENT_AMBULATORY_CARE_PROVIDER_SITE_OTHER): Payer: 59 | Admitting: Registered Nurse

## 2020-11-03 ENCOUNTER — Other Ambulatory Visit: Payer: Self-pay

## 2020-11-03 VITALS — BP 149/104 | HR 85 | Temp 98.3°F | Resp 18 | Ht 69.0 in | Wt 295.8 lb

## 2020-11-03 DIAGNOSIS — R42 Dizziness and giddiness: Secondary | ICD-10-CM | POA: Diagnosis not present

## 2020-11-03 DIAGNOSIS — R202 Paresthesia of skin: Secondary | ICD-10-CM

## 2020-11-03 DIAGNOSIS — H6690 Otitis media, unspecified, unspecified ear: Secondary | ICD-10-CM | POA: Diagnosis not present

## 2020-11-03 DIAGNOSIS — I10 Essential (primary) hypertension: Secondary | ICD-10-CM

## 2020-11-03 LAB — VITAMIN B12: Vitamin B-12: 520 pg/mL (ref 211–911)

## 2020-11-03 LAB — VITAMIN D 25 HYDROXY (VIT D DEFICIENCY, FRACTURES): VITD: 9.01 ng/mL — ABNORMAL LOW (ref 30.00–100.00)

## 2020-11-03 LAB — TSH: TSH: 1.98 u[IU]/mL (ref 0.35–4.50)

## 2020-11-03 LAB — HEMOGLOBIN A1C: Hgb A1c MFr Bld: 6.1 % (ref 4.6–6.5)

## 2020-11-03 MED ORDER — HYDROCORTISONE-ACETIC ACID 1-2 % OT SOLN: 3.0000 [drp] | Freq: Three times a day (TID) | OTIC | 0 refills | Status: DC

## 2020-11-03 MED ORDER — LEVOFLOXACIN 500 MG PO TABS: 500.0000 mg | ORAL_TABLET | Freq: Every day | ORAL | 0 refills | Status: AC

## 2020-11-03 MED ORDER — AMLODIPINE BESYLATE 2.5 MG PO TABS
2.5000 mg | ORAL_TABLET | Freq: Every day | ORAL | 3 refills | Status: DC
Start: 2020-11-03 — End: 2021-07-13

## 2020-11-03 MED ORDER — MECLIZINE HCL 50 MG PO TABS: 25.0000 mg | ORAL_TABLET | Freq: Two times a day (BID) | ORAL | 0 refills | Status: DC | PRN

## 2020-11-03 NOTE — Patient Instructions (Addendum)
Mr. Sherron Flemings [place] to see you as always  Medications: Levofloxacin (Levaquin) 500mg  once daily for 7 days. This is an antibiotic for infection Amlodipine (Norvasc) 2.5mg  once daily - this is for blood pressure Meclizine (Antivert) 25 mg (half tablet) once or twice daily for dizziness. Only use when you need it. Acetic Acid - Hydrocortisone (Vosol-HC): 3 drops in each ear 3-4 times daily until infection resolves  Lab Work: A1c - a 3 mo look at blood sugars. Vitamin D and B12 - this will help determine if a deficiency is contributing to numbness TSH - this is for thyroid function which can contribute to a variety of symptoms, including ones you've experienced.  I'll be in touch if there are concerning results.  Let me know if things get worse or don't get better  Thanks,  Rich    If you have lab work done today you will be contacted with your lab results within the next 2 weeks.  If you have not heard from then please contact us. The fastest way to get your results is to register for My Chart.   IF you received an x-ray today, you will receive an invoice from [facility]. Please contact [facility] at [phone] with questions or concerns regarding your invoice.   IF you received labwork today, you will receive an invoice from [facility]. Please contact [facility] at [phone] with questions or concerns regarding your invoice.   Our billing staff will not be able to assist you with questions regarding bills from these companies.  You will be contacted with the lab results as soon as they are available. The fastest way to get your results is to activate your My Chart account. Instructions are located on the last page of this paperwork. If you have not heard from [phone] regarding the results in 2 weeks, please contact this office.

## 2020-11-04 ENCOUNTER — Other Ambulatory Visit: Payer: Self-pay | Admitting: Registered Nurse

## 2020-11-04 ENCOUNTER — Encounter: Payer: Self-pay | Admitting: Registered Nurse

## 2020-11-04 DIAGNOSIS — E559 Vitamin D deficiency, unspecified: Secondary | ICD-10-CM

## 2020-11-04 MED ORDER — VITAMIN D (ERGOCALCIFEROL) 1.25 MG (50000 UNIT) PO CAPS: 50000.0000 [IU] | ORAL_CAPSULE | ORAL | 0 refills | Status: DC

## 2020-12-01 ENCOUNTER — Encounter: Payer: Self-pay | Admitting: Registered Nurse

## 2020-12-01 ENCOUNTER — Ambulatory Visit (INDEPENDENT_AMBULATORY_CARE_PROVIDER_SITE_OTHER): Payer: 59 | Admitting: Registered Nurse

## 2020-12-01 ENCOUNTER — Ambulatory Visit
Admission: RE | Admit: 2020-12-01 | Discharge: 2020-12-01 | Disposition: A | Discharge status: Home / Self Care | Payer: 59 | Source: Ambulatory Visit | Attending: Registered Nurse | Admitting: Registered Nurse

## 2020-12-01 ENCOUNTER — Other Ambulatory Visit: Payer: Self-pay

## 2020-12-01 VITALS — BP 150/91 | HR 87 | Temp 98.2°F | Resp 18 | Ht 69.0 in | Wt 296.8 lb

## 2020-12-01 DIAGNOSIS — E559 Vitamin D deficiency, unspecified: Secondary | ICD-10-CM

## 2020-12-01 DIAGNOSIS — Z13228 Encounter for screening for other metabolic disorders: Secondary | ICD-10-CM

## 2020-12-01 DIAGNOSIS — M542 Cervicalgia: Secondary | ICD-10-CM

## 2020-12-01 DIAGNOSIS — R0981 Nasal congestion: Secondary | ICD-10-CM

## 2020-12-01 DIAGNOSIS — Z1329 Encounter for screening for other suspected endocrine disorder: Secondary | ICD-10-CM | POA: Diagnosis not present

## 2020-12-01 DIAGNOSIS — Z13220 Encounter for screening for lipoid disorders: Secondary | ICD-10-CM | POA: Diagnosis not present

## 2020-12-01 DIAGNOSIS — Z0000 Encounter for general adult medical examination without abnormal findings: Secondary | ICD-10-CM | POA: Diagnosis not present

## 2020-12-01 DIAGNOSIS — Z130 Encounter for screening for diseases of the blood and blood-forming organs and certain disorders involving the immune mechanism: Secondary | ICD-10-CM | POA: Diagnosis not present

## 2020-12-01 DIAGNOSIS — G8929 Other chronic pain: Secondary | ICD-10-CM

## 2020-12-01 LAB — COMPREHENSIVE METABOLIC PANEL
ALT: 30 U/L (ref 0–53)
AST: 20 U/L (ref 0–37)
Albumin: 4.5 g/dL (ref 3.5–5.2)
Alkaline Phosphatase: 107 U/L (ref 39–117)
BUN: 14 mg/dL (ref 6–23)
CO2: 28 mEq/L (ref 19–32)
Calcium: 9.5 mg/dL (ref 8.4–10.5)
Chloride: 99 mEq/L (ref 96–112)
Creatinine, Ser: 0.63 mg/dL (ref 0.40–1.50)
GFR: 129.54 mL/min (ref >60.00)
Glucose, Bld: 101 mg/dL — ABNORMAL HIGH (ref 70–99)
Potassium: 4.3 mEq/L (ref 3.5–5.1)
Sodium: 137 mEq/L (ref 135–145)
Total Bilirubin: 0.5 mg/dL (ref 0.2–1.2)
Total Protein: 7.5 g/dL (ref 6.0–8.3)

## 2020-12-01 LAB — LIPID PANEL
Cholesterol: 200 mg/dL (ref 0–200)
HDL: 39.6 mg/dL (ref >39.00)
NonHDL: 159.94
Total CHOL/HDL Ratio: 5
Triglycerides: 215 mg/dL — ABNORMAL HIGH (ref 0.0–149.0)
VLDL: 43 mg/dL — ABNORMAL HIGH (ref 0.0–40.0)

## 2020-12-01 LAB — VITAMIN D 25 HYDROXY (VIT D DEFICIENCY, FRACTURES): VITD: 10.59 ng/mL — ABNORMAL LOW (ref 30.00–100.00)

## 2020-12-01 LAB — LDL CHOLESTEROL, DIRECT: Direct LDL: 144 mg/dL

## 2020-12-01 LAB — TSH: TSH: 1.85 u[IU]/mL (ref 0.35–4.50)

## 2020-12-01 MED ORDER — FLUTICASONE PROPIONATE 50 MCG/ACT NA SUSP: 2.0000 | Freq: Every day | NASAL | 6 refills | Status: DC

## 2020-12-01 NOTE — Patient Instructions (Addendum)
Mr. Jackson Wallace - [place]rano  Good to see you No concerns on exam Glad you're feeling better than you were a few weeks ago  Labs today to recheck Vit D, check liver and kidney function, cholesterol, thyroid, and sugars. I'll let you know if there are any concerns. Results will likely be in by the end of the day today.  Work on stretching the neck, back, and pectorals. Focus on the doorway stretch that we discussed. You can walk in for an xray at [facility] [employer]. Results will be sent to be and be available in MyChart.  Using flonase in each nostril 1-2 times each day as needed can help with the nasal congestion. We can get you in with an ENT specialist if needed as well.  See you in around 6 mo to check in on chronic conditions  Thank you  [name]     If you have lab work done today you will be contacted with your lab results within the next 2 weeks.  If you have not heard from [place] then please contact us. The fastest way to get your results is to register for My Chart.   IF you received an x-ray today, you will receive an invoice from [facility]. Please contact [facility] at [phone] with questions or concerns regarding your invoice.   IF you received labwork today, you will receive an invoice from [facility]. Please contact [facility] at [phone] with questions or concerns regarding your invoice.   Our billing staff will not be able to assist you with questions regarding bills from these companies.  You will be contacted with the lab results as soon as they are available. The fastest way to get your results is to activate your My Chart account. Instructions are located on the last page of this paperwork. If you have not heard from [place] regarding the results in 2 weeks, please contact this office.      Health Maintenance, Male Adopting a healthy lifestyle and getting preventive care are important in promoting health and wellness. Ask your health  care provider about:  The right schedule for you to have regular tests and exams.  Things you can do on your own to prevent diseases and keep yourself healthy. What should I know about diet, weight, and exercise? Eat a healthy diet  Eat a diet that includes plenty of vegetables, fruits, low-fat dairy products, and lean protein.  Do not eat a lot of foods that are high in solid fats, added sugars, or sodium.   Maintain a healthy weight Body mass index (BMI) is a measurement that can be used to identify possible weight problems. It estimates body fat based on height and weight. Your health care provider can help determine your BMI and help you achieve or maintain a healthy weight. Get regular exercise Get regular exercise. This is one of the most important things you can do for your health. Most adults should:  Exercise for at least 150 minutes each week. The exercise should increase your heart rate and make you sweat (moderate-intensity exercise).  Do strengthening exercises at least twice a week. This is in addition to the moderate-intensity exercise.  Spend less time sitting. Even light physical activity can be beneficial. Watch cholesterol and blood lipids Have your blood tested for lipids and cholesterol at 29 years of age, then have this test every 5 years. You may need to have your cholesterol levels checked more often if:  Your lipid  or cholesterol levels are high.  You are older than 29 years of age.  You are at high risk for heart disease. What should I know about cancer screening? Many types of cancers can be detected early and may often be prevented. Depending on your health history and family history, you may need to have cancer screening at various ages. This may include screening for:  Colorectal cancer.  Prostate cancer.  Skin cancer.  Lung cancer. What should I know about heart disease, diabetes, and high blood pressure? Blood pressure and heart disease  High  blood pressure causes heart disease and increases the risk of stroke. This is more likely to develop in people who have high blood pressure readings, are of African descent, or are overweight.  Talk with your health care provider about your target blood pressure readings.  Have your blood pressure checked: ? Every 3-5 years if you are 19-56 years of age. ? Every year if you are 38 years old or older.  If you are between the ages of 47 and 39 and are a current or former smoker, ask your health care provider if you should have a one-time screening for abdominal aortic aneurysm (AAA). Diabetes Have regular diabetes screenings. This checks your fasting blood sugar level. Have the screening done:  Once every three years after age 75 if you are at a normal weight and have a low risk for diabetes.  More often and at a younger age if you are overweight or have a high risk for diabetes. What should I know about preventing infection? Hepatitis B If you have a higher risk for hepatitis B, you should be screened for this virus. Talk with your health care provider to find out if you are at risk for hepatitis B infection. Hepatitis C Blood testing is recommended for:  Everyone born from [birth year] through 1964.  Anyone with known risk factors for hepatitis C. Sexually transmitted infections (STIs)  You should be screened each year for STIs, including gonorrhea and chlamydia, if: ? You are sexually active and are younger than 29 years of age. ? You are older than 29 years of age and your health care provider tells you that you are at risk for this type of infection. ? Your sexual activity has changed since you were last screened, and you are at increased risk for chlamydia or gonorrhea. Ask your health care provider if you are at risk.  Ask your health care provider about whether you are at high risk for HIV. Your health care provider may recommend a prescription medicine to help prevent HIV infection. If  you choose to take medicine to prevent HIV, you should first get tested for HIV. You should then be tested every 3 months for as long as you are taking the medicine. Follow these instructions at home: Lifestyle  Do not use any products that contain nicotine or tobacco, such as cigarettes, e-cigarettes, and chewing tobacco. If you need help quitting, ask your health care provider.  Do not use street drugs.  Do not share needles.  Ask your health care provider for help if you need support or information about quitting drugs. Alcohol use  Do not drink alcohol if your health care provider tells you not to drink.  If you drink alcohol: ? Limit how much you have to 0-2 drinks a day. ? Be aware of how much alcohol is in your drink. In the [place] one drink equals one 12 oz bottle of beer (355 mL),  one 5 oz glass of wine (148 mL), or one 1 oz glass of hard liquor (44 mL). General instructions  Schedule regular health, dental, and eye exams.  Stay current with your vaccines.  Tell your health care provider if: ? You often feel depressed. ? You have ever been abused or do not feel safe at home. Summary  Adopting a healthy lifestyle and getting preventive care are important in promoting health and wellness.  Follow your health care provider's instructions about healthy diet, exercising, and getting tested or screened for diseases.  Follow your health care provider's instructions on monitoring your cholesterol and blood pressure. This information is not intended to replace advice given to you by your health care provider. Make sure you discuss any questions you have with your health care provider. Document Revised: 06/20/2018 Document Reviewed: 06/20/2018 Elsevier Patient Education  2021 Elsevier Inc.  Medical Screening Exam  A medical screening exam helps determine whether or not you need immediate medical treatment. This type of exam may be done in the emergency department, an urgent  care setting, or your health care provider's office. During the exam, a health care provider does a short physical exam and asks about your medical history to assess:  Your current symptoms.  Your overall health. Depending on your symptoms, you may need additional tests. What are the possible outcomes of a medical screening exam? Your medical screening exam may determine that:  You do not need emergency treatment at this time.  You need treatment right away.  You need to be transferred to another medical center.  You need to have more tests. A medical specialist may be consulted if necessary. When should I seek medical care? If you have a regular health care provider, make an appointment for a follow-up visit with him or her. If you do not have a regular health care provider, ask about resources in your community. Get help right away if:  Your condition gets worse or you develop new or troubling symptoms before you see your health care provider. If this occurs, go to an emergency department right away. In an emergency:  Call 911 or have someone drive you to the nearest hospital.  Do not drive yourself. Summary  A medical screening exam helps determine whether or not you need immediate medical treatment.  During the exam, a health care provider does a short physical exam and asks about your current symptoms and overall health.  More tests may be ordered during the exam.  You may need to be transferred to another medical center. This information is not intended to replace advice given to you by your health care provider. Make sure you discuss any questions you have with your health care provider. Document Revised: 10/19/2018 Document Reviewed: 01/23/2018 Elsevier Patient Education  2021 [employer]  Preventive Care 5121-29 Years Old, Male Preventive care refers to lifestyle choices and visits with your health care provider that can promote health and wellness. This  includes:  A yearly physical exam. This is also called an annual wellness visit.  Regular dental and eye exams.  Immunizations.  Screening for certain conditions.  Healthy lifestyle choices, such as: ? Eating a healthy diet. ? Getting regular exercise. ? Not using drugs or products that contain nicotine and tobacco. ? Limiting alcohol use. What can I expect for my preventive care visit? Physical exam Your health care provider may check your:  Height and weight. These may be used to calculate your BMI (body mass index). BMI  is a measurement that tells if you are at a healthy weight.  Heart rate and blood pressure.  Body temperature.  Skin for abnormal spots. Counseling Your health care provider may ask you questions about your:  Past medical problems.  Family's medical history.  Alcohol, tobacco, and drug use.  Emotional well-being.  Home life and relationship well-being.  Sexual activity.  Diet, exercise, and sleep habits.  Work and work Astronomer.  Access to firearms. What immunizations do I need? Vaccines are usually given at various ages, according to a schedule. Your health care provider will recommend vaccines for you based on your age, medical history, and lifestyle or other factors, such as travel or where you work.   What tests do I need? Blood tests  Lipid and cholesterol levels. These may be checked every 5 years starting at age 76.  Hepatitis C test.  Hepatitis B test. Screening  Diabetes screening. This is done by checking your blood sugar (glucose) after you have not eaten for a while (fasting).  Genital exam to check for testicular cancer or hernias.  STD (sexually transmitted disease) testing, if you are at risk. Talk with your health care provider about your test results, treatment options, and if necessary, the need for more tests.   Follow these instructions at home: Eating and drinking  Eat a healthy diet that includes fresh  fruits and vegetables, whole grains, lean protein, and low-fat dairy products.  Drink enough fluid to keep your urine pale yellow.  Take vitamin and mineral supplements as recommended by your health care provider.  Do not drink alcohol if your health care provider tells you not to drink.  If you drink alcohol: ? Limit how much you have to 0-2 drinks a day. ? Be aware of how much alcohol is in your drink. In the [place] one drink equals one 12 oz bottle of beer (355 mL), one 5 oz glass of wine (148 mL), or one 1 oz glass of hard liquor (44 mL).   Lifestyle  Take daily care of your teeth and gums. Brush your teeth every morning and night with fluoride toothpaste. Floss one time each day.  Stay active. Exercise for at least 30 minutes 5 or more days each week.  Do not use any products that contain nicotine or tobacco, such as cigarettes, e-cigarettes, and chewing tobacco. If you need help quitting, ask your health care provider.  Do not use drugs.  If you are sexually active, practice safe sex. Use a condom or other form of protection to prevent STIs (sexually transmitted infections).  Find healthy ways to cope with stress, such as: ? Meditation, yoga, or listening to music. ? Journaling. ? Talking to a trusted person. ? Spending time with friends and family. Safety  Always wear your seat belt while driving or riding in a vehicle.  Do not drive: ? If you have been drinking alcohol. Do not ride with someone who has been drinking. ? When you are tired or distracted. ? While texting.  Wear a helmet and other protective equipment during sports activities.  If you have firearms in your house, make sure you follow all gun safety procedures.  Seek help if you have been physically or sexually abused. What's next?  Go to your health care provider once a year for an annual wellness visit.  Ask your health care provider how often you should have your eyes and teeth checked.  Stay up  to date on all vaccines. This information  is not intended to replace advice given to you by your health care provider. Make sure you discuss any questions you have with your health care provider. Document Revised: 03/13/2019 Document Reviewed: 06/21/2018 Elsevier Patient Education  2021 [employer].
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
## 2020-12-02 ENCOUNTER — Other Ambulatory Visit: Payer: Self-pay | Admitting: Registered Nurse

## 2020-12-02 DIAGNOSIS — H6690 Otitis media, unspecified, unspecified ear: Secondary | ICD-10-CM

## 2020-12-03 MED ORDER — HYDROCORTISONE-ACETIC ACID 1-2 % OT SOLN: 3.0000 [drp] | Freq: Three times a day (TID) | OTIC | 0 refills | Status: DC

## 2021-05-02 NOTE — Progress Notes (Signed)
Established Patient Office Visit  Subjective:  Patient ID: Jackson Wallace, male    DOB: 01-Jun-1991  Age: 30 y.o. MRN: [id number]  CC:  Chief Complaint  Patient presents with  . Hospitalization Follow-up    Patient states he is here for a hospital follow up for a ear infection and hypertension. Patient went to urgent care but antibiotics is not working. Patient also was experiencing dizziness.    HPI Jackson Wallace presents for follow up, otitis  Otitis Went to urgent care, given abx. Ineffective. Continued pressure. No drainage or worsening symptoms. Hearing intact.  Hypertension: Patient Currently taking: amlodipine 2.5mg  po qd Good effect. No AEs. Denies CV symptoms including: chest pain, shob, doe, headache, visual changes, fatigue, claudication, and dependent edema.   Previous readings and labs: BP Readings from Last 3 Encounters:  12/01/20 (!) 150/91  11/03/20 (!) 149/104  10/23/20 (!) 139/94   Lab Results  Component Value Date   CREATININE 0.63 12/01/2020    No other concerns  Past Medical History:  Diagnosis Date  . Allergy     No past surgical history on file.  Family History  Problem Relation Age of Onset  . Diabetes Maternal Grandmother     Social History   Socioeconomic History  . Marital status: Significant Other    Spouse name: Not on file  . Number of children: 0  . Years of education: Not on file  . Highest education level: Not on file  Occupational History  . Occupation: driver  Tobacco Use  . Smoking status: Never  . Smokeless tobacco: Never  Vaping Use  . Vaping Use: Never used  Substance and Sexual Activity  . Alcohol use: No  . Drug use: No  . Sexual activity: Not Currently  Other Topics Concern  . Not on file  Social History Narrative  . Not on file   Social Determinants of Health   Financial Resource Strain: Not on file  Food Insecurity: Not on file  Transportation Needs: Not on file   Physical Activity: Not on file  Stress: Not on file  Social Connections: Not on file  Intimate Partner Violence: Not on file    Outpatient Medications Prior to Visit  Medication Sig Dispense Refill  . albuterol (VENTOLIN HFA) 108 (90 Base) MCG/ACT inhaler Inhale 2 puffs into the lungs every 6 (six) hours as needed for wheezing or shortness of breath. (Patient not taking: No sig reported) 8 g 0  . amoxicillin-clavulanate (AUGMENTIN) 875-125 MG tablet Take 1 tablet by mouth 2 (two) times daily. (Patient not taking: No sig reported)    . aspirin-acetaminophen-caffeine (EXCEDRIN MIGRAINE) 250-250-65 MG tablet Take 1-2 tablets by mouth every 6 (six) hours as needed for headache. (Patient not taking: No sig reported)    . benzonatate (TESSALON) 200 MG capsule Take 1 capsule (200 mg total) by mouth 2 (two) times daily as needed for cough. (Patient not taking: No sig reported) 20 capsule 0  . cetirizine (ZYRTEC) 10 MG tablet TAKE 1 TABLET(10 MG) BY MOUTH DAILY 30 tablet 1  . CIPRODEX OTIC suspension Place 4 drops into both ears 2 (two) times daily.    Marland Kitchen guaiFENesin-dextromethorphan (ROBITUSSIN DM) 100-10 MG/5ML syrup Take 5 mLs by mouth every 4 (four) hours as needed for cough. (Patient not taking: No sig reported) 118 mL 0  . neomycin-polymyxin-hydrocortisone (CORTISPORIN) 3.5-10000-1 OTIC suspension Place 4 drops into both ears daily. (Patient not taking: Reported on 12/01/2020)     No facility-administered  medications prior to visit.    Allergies  Allergen Reactions  . Shrimp [Shellfish Allergy]     ROS Review of Systems  Constitutional: Negative.   HENT: Negative.    Eyes: Negative.   Respiratory: Negative.    Cardiovascular: Negative.   Gastrointestinal: Negative.   Genitourinary: Negative.   Musculoskeletal: Negative.   Skin: Negative.   Neurological: Negative.   Psychiatric/Behavioral: Negative.    All other systems reviewed and are negative.    Objective:    Physical  Exam Constitutional:      General: He is not in acute distress.    Appearance: Normal appearance. He is normal weight. He is not ill-appearing, toxic-appearing or diaphoretic.  HENT:     Right Ear: Ear canal and external ear normal. There is no impacted cerumen. Tympanic membrane is bulging.     Left Ear: Ear canal and external ear normal. There is no impacted cerumen. Tympanic membrane is bulging.  Cardiovascular:     Rate and Rhythm: Normal rate and regular rhythm.     Heart sounds: Normal heart sounds. No murmur heard.   No friction rub. No gallop.  Pulmonary:     Effort: Pulmonary effort is normal. No respiratory distress.     Breath sounds: Normal breath sounds. No stridor. No wheezing, rhonchi or rales.  Chest:     Chest wall: No tenderness.  Neurological:     General: No focal deficit present.     Mental Status: He is alert and oriented to person, place, and time. Mental status is at baseline.  Psychiatric:        Mood and Affect: Mood normal.        Behavior: Behavior normal.        Thought Content: Thought content normal.        Judgment: Judgment normal.    BP (!) 149/104   Pulse 85   Temp 98.3 F (36.8 C) (Temporal)   Resp 18   Ht 5\' 9"  (1.753 m)   Wt 295 lb 12.8 oz (134.2 kg)   SpO2 99%   BMI 43.68 kg/m  Wt Readings from Last 3 Encounters:  12/01/20 296 lb 12.8 oz (134.6 kg)  11/03/20 295 lb 12.8 oz (134.2 kg)  10/23/20 292 lb (132.5 kg)     Health Maintenance Due  Topic Date Due  . COVID-19 Vaccine (1) Never done  . INFLUENZA VACCINE  02/08/2021    There are no preventive care reminders to display for this patient.  Lab Results  Component Value Date   TSH 1.85 12/01/2020   Lab Results  Component Value Date   WBC 8.7 10/23/2020   HGB 13.2 10/23/2020   HCT 40.0 10/23/2020   MCV 80.6 10/23/2020   PLT 340 10/23/2020   Lab Results  Component Value Date   NA 137 12/01/2020   K 4.3 12/01/2020   CO2 28 12/01/2020   GLUCOSE 101 (H) 12/01/2020    BUN 14 12/01/2020   CREATININE 0.63 12/01/2020   BILITOT 0.5 12/01/2020   ALKPHOS 107 12/01/2020   AST 20 12/01/2020   ALT 30 12/01/2020   PROT 7.5 12/01/2020   ALBUMIN 4.5 12/01/2020   CALCIUM 9.5 12/01/2020   ANIONGAP 9 10/23/2020   GFR 129.54 12/01/2020   Lab Results  Component Value Date   CHOL 200 12/01/2020   Lab Results  Component Value Date   HDL 39.60 12/01/2020   Lab Results  Component Value Date   LDLCALC 120 (H) 09/24/2019  Lab Results  Component Value Date   TRIG 215.0 (H) 12/01/2020   Lab Results  Component Value Date   CHOLHDL 5 12/01/2020   Lab Results  Component Value Date   HGBA1C 6.1 11/03/2020      Assessment & Plan:   Problem List Items Addressed This Visit   None Visit Diagnoses     Essential hypertension    -  Primary   Relevant Medications   amLODipine (NORVASC) 2.5 MG tablet   Dizziness       Relevant Medications   meclizine (ANTIVERT) 50 MG tablet   Other Relevant Orders   TSH (Completed)   Hemoglobin A1c (Completed)   Vitamin D (25 hydroxy) (Completed)   B12 (Completed)   Acute otitis media, unspecified otitis media type       Paresthesia of arm       Relevant Orders   TSH (Completed)   Hemoglobin A1c (Completed)   Vitamin D (25 hydroxy) (Completed)   B12 (Completed)       Meds ordered this encounter  Medications  . levofloxacin (LEVAQUIN) 500 MG tablet    Sig: Take 1 tablet (500 mg total) by mouth daily for 7 days.    Dispense:  7 tablet    Refill:  0    Order Specific Question:   Supervising Provider    Answer:   Neva Seat, JEFFREY R [[id number]]  . meclizine (ANTIVERT) 50 MG tablet    Sig: Take 0.5 tablets (25 mg total) by mouth 2 (two) times daily as needed.    Dispense:  30 tablet    Refill:  0    Order Specific Question:   Supervising Provider    Answer:   Neva Seat, JEFFREY R [[id number]]  . amLODipine (NORVASC) 2.5 MG tablet    Sig: Take 1 tablet (2.5 mg total) by mouth daily.    Dispense:  90 tablet    Refill:  3     Order Specific Question:   Supervising Provider    Answer:   Neva Seat, JEFFREY R [[id number]]  . DISCONTD: acetic acid-hydrocortisone (VOSOL-HC) OTIC solution    Sig: Place 3 drops into both ears 3 (three) times daily.    Dispense:  10 mL    Refill:  0    Order Specific Question:   Supervising Provider    Answer:   Neva Seat, JEFFREY R [[id number]]    Follow-up: No follow-ups on file.   PLAN Levaquin for ongoing otitis. Vosol hc for symptom relief. Meclizine for any vertiginous symptoms. Amlodipine for bp. Return in 1-2 mo for recheck. Patient encouraged to call clinic with any questions, comments, or concerns.  Janeece Agee, NP

## 2021-06-08 ENCOUNTER — Ambulatory Visit: Payer: 59 | Admitting: Registered Nurse

## 2021-06-27 DIAGNOSIS — H9202 Otalgia, left ear: Secondary | ICD-10-CM | POA: Insufficient documentation

## 2021-07-13 ENCOUNTER — Encounter: Payer: Self-pay | Admitting: Registered Nurse

## 2021-07-13 ENCOUNTER — Ambulatory Visit (INDEPENDENT_AMBULATORY_CARE_PROVIDER_SITE_OTHER): Payer: 59 | Admitting: Registered Nurse

## 2021-07-13 VITALS — BP 158/90 | HR 87 | Temp 98.3°F | Resp 17 | Ht 69.0 in | Wt 299.4 lb

## 2021-07-13 DIAGNOSIS — I10 Essential (primary) hypertension: Secondary | ICD-10-CM | POA: Diagnosis not present

## 2021-07-13 DIAGNOSIS — E559 Vitamin D deficiency, unspecified: Secondary | ICD-10-CM

## 2021-07-13 DIAGNOSIS — Z13220 Encounter for screening for lipoid disorders: Secondary | ICD-10-CM | POA: Diagnosis not present

## 2021-07-13 LAB — LIPID PANEL
Cholesterol: 191 mg/dL (ref 0–200)
HDL: 33.5 mg/dL — ABNORMAL LOW (ref >39.00)
NonHDL: 157.47
Total CHOL/HDL Ratio: 6
Triglycerides: 229 mg/dL — ABNORMAL HIGH (ref 0.0–149.0)
VLDL: 45.8 mg/dL — ABNORMAL HIGH (ref 0.0–40.0)

## 2021-07-13 LAB — LDL CHOLESTEROL, DIRECT: Direct LDL: 130 mg/dL

## 2021-07-13 LAB — VITAMIN D 25 HYDROXY (VIT D DEFICIENCY, FRACTURES): VITD: 14.73 ng/mL — ABNORMAL LOW (ref 30.00–100.00)

## 2021-07-13 MED ORDER — LOSARTAN POTASSIUM-HCTZ 50-12.5 MG PO TABS: 1.0000 | ORAL_TABLET | Freq: Every day | ORAL | 1 refills | Status: DC

## 2021-07-13 NOTE — Progress Notes (Signed)
Established Patient Office Visit  Subjective:  Patient ID: Jackson Wallace, male    DOB: 02/05/1991  Age: 31 y.o. MRN: [id number]  CC:  Chief Complaint  Patient presents with   Hypertension    Pt reports no physical sxs, has not been taking amlodipine due to reflux when taking BP out of range today     HPI [name] presents for htn  Hypertension: Patient Currently taking: no medication Had been on amlodipine 2.5mg  but gave him reflux, stopped taking it.  Denies CV symptoms including: chest pain, shob, doe, headache, visual changes, fatigue, claudication, and dependent edema.   Previous readings and labs: BP Readings from Last 3 Encounters:  07/13/21 (!) 158/90  12/01/20 (!) 150/91  11/03/20 (!) 149/104   Lab Results  Component Value Date   CREATININE 0.63 12/01/2020   Notes recent respiratory infection - improving. Using OTCs with good relief.   Vitamin D Deficiency Taking 5,000 units daily More energy, feeling better, paresthesias resolved.  Past Medical History:  Diagnosis Date   Allergy     History reviewed. No pertinent surgical history.  Family History  Problem Relation Age of Onset   Diabetes Maternal Grandmother     Social History   Socioeconomic History   Marital status: Significant Other    Spouse name: Not on file   Number of children: 0   Years of education: Not on file   Highest education level: Not on file  Occupational History   Occupation: driver  Tobacco Use   Smoking status: Never   Smokeless tobacco: Never  Vaping Use   Vaping Use: Never used  Substance and Sexual Activity   Alcohol use: No   Drug use: No   Sexual activity: Not Currently  Other Topics Concern   Not on file  Social History Narrative   Not on file   Social Determinants of Health   Financial Resource Strain: Not on file  Food Insecurity: Not on file  Transportation Needs: Not on file  Physical Activity: Not on file  Stress:  Not on file  Social Connections: Not on file  Intimate Partner Violence: Not on file    Outpatient Medications Prior to Visit  Medication Sig Dispense Refill   acetic acid-hydrocortisone (VOSOL-HC) OTIC solution Place 3 drops into both ears 3 (three) times daily. 10 mL 0   cetirizine (ZYRTEC) 10 MG tablet TAKE 1 TABLET(10 MG) BY MOUTH DAILY 30 tablet 1   CIPRODEX OTIC suspension Place 4 drops into both ears 2 (two) times daily.     fluticasone (FLONASE) 50 MCG/ACT nasal spray Place 2 sprays into both nostrils daily. 16 g 6   meclizine (ANTIVERT) 50 MG tablet Take 0.5 tablets (25 mg total) by mouth 2 (two) times daily as needed. 30 tablet 0   Vitamin D, Ergocalciferol, (DRISDOL) 1.25 MG (50000 UNIT) CAPS capsule Take 1 capsule (50,000 Units total) by mouth every 7 (seven) days. 12 capsule 0   amLODipine (NORVASC) 2.5 MG tablet Take 1 tablet (2.5 mg total) by mouth daily. (Patient not taking: Reported on 07/13/2021) 90 tablet 3   No facility-administered medications prior to visit.    Allergies  Allergen Reactions   Shrimp [Shellfish Allergy]     ROS Review of Systems  Constitutional: Negative.   HENT: Negative.    Eyes: Negative.   Respiratory: Negative.    Cardiovascular: Negative.   Gastrointestinal: Negative.   Genitourinary: Negative.   Musculoskeletal: Negative.   Skin: Negative.  Neurological: Negative.   Psychiatric/Behavioral: Negative.    All other systems reviewed and are negative.    Objective:    Physical Exam Constitutional:      General: He is not in acute distress.    Appearance: Normal appearance. He is normal weight. He is not ill-appearing, toxic-appearing or diaphoretic.  Cardiovascular:     Rate and Rhythm: Normal rate and regular rhythm.     Heart sounds: Normal heart sounds. No murmur heard.   No friction rub. No gallop.  Pulmonary:     Effort: Pulmonary effort is normal. No respiratory distress.     Breath sounds: Normal breath sounds. No  stridor. No wheezing, rhonchi or rales.  Chest:     Chest wall: No tenderness.  Neurological:     General: No focal deficit present.     Mental Status: He is alert and oriented to person, place, and time. Mental status is at baseline.  Psychiatric:        Mood and Affect: Mood normal.        Behavior: Behavior normal.        Thought Content: Thought content normal.        Judgment: Judgment normal.    BP (!) 158/90    Pulse 87    Temp 98.3 F (36.8 C) (Temporal)    Resp 17    Ht 5\' 9"  (1.753 m)    Wt 299 lb 6.4 oz (135.8 kg)    SpO2 97%    BMI 44.21 kg/m  Wt Readings from Last 3 Encounters:  07/13/21 299 lb 6.4 oz (135.8 kg)  12/01/20 296 lb 12.8 oz (134.6 kg)  11/03/20 295 lb 12.8 oz (134.2 kg)     Health Maintenance Due  Topic Date Due   COVID-19 Vaccine (1) Never done   INFLUENZA VACCINE  02/08/2021    There are no preventive care reminders to display for this patient.  Lab Results  Component Value Date   TSH 1.85 12/01/2020   Lab Results  Component Value Date   WBC 8.7 10/23/2020   HGB 13.2 10/23/2020   HCT 40.0 10/23/2020   MCV 80.6 10/23/2020   PLT 340 10/23/2020   Lab Results  Component Value Date   NA 137 12/01/2020   K 4.3 12/01/2020   CO2 28 12/01/2020   GLUCOSE 101 (H) 12/01/2020   BUN 14 12/01/2020   CREATININE 0.63 12/01/2020   BILITOT 0.5 12/01/2020   ALKPHOS 107 12/01/2020   AST 20 12/01/2020   ALT 30 12/01/2020   PROT 7.5 12/01/2020   ALBUMIN 4.5 12/01/2020   CALCIUM 9.5 12/01/2020   ANIONGAP 9 10/23/2020   GFR 129.54 12/01/2020   Lab Results  Component Value Date   CHOL 200 12/01/2020   Lab Results  Component Value Date   HDL 39.60 12/01/2020   Lab Results  Component Value Date   LDLCALC 120 (H) 09/24/2019   Lab Results  Component Value Date   TRIG 215.0 (H) 12/01/2020   Lab Results  Component Value Date   CHOLHDL 5 12/01/2020   Lab Results  Component Value Date   HGBA1C 6.1 11/03/2020      Assessment & Plan:    Problem List Items Addressed This Visit   None Visit Diagnoses     Essential hypertension    -  Primary   Relevant Medications   losartan-hydrochlorothiazide (HYZAAR) 50-12.5 MG tablet   Lipid screening       Relevant Orders   Lipid panel  Vitamin D deficiency       Relevant Orders   Vitamin D (25 hydroxy)       Meds ordered this encounter  Medications   losartan-hydrochlorothiazide (HYZAAR) 50-12.5 MG tablet    Sig: Take 1 tablet by mouth daily.    Dispense:  90 tablet    Refill:  1    Order Specific Question:   Supervising Provider    Answer:   Neva Seat, JEFFREY R [[id number]]    Follow-up: Return in about 3 weeks (around 08/03/2021) for bp check - nurse visit - again in 6 mo for CPE and labs.Marland Kitchen   PLAN Start losartan-hctz 50-12.5mg  po qd Bp check in 3 week - nurse visit Labs collected. Will follow up with the patient as warranted. CPE and labs in 6 mo Patient encouraged to call clinic with any questions, comments, or concerns.  Janeece Agee, NP

## 2021-07-13 NOTE — Patient Instructions (Signed)
Great to see you!  Start losartan-hctz 50-12.5mg    Will recheck labs today. I'll call with any concerns  Come back to check bp in 3 weeks or so  See you in 6 mo for a physical and labs.  Thank you  Rich

## 2021-08-03 ENCOUNTER — Ambulatory Visit: Payer: 59 | Admitting: Registered Nurse

## 2021-08-03 VITALS — BP 130/78

## 2021-08-03 DIAGNOSIS — I10 Essential (primary) hypertension: Secondary | ICD-10-CM

## 2021-08-03 NOTE — Progress Notes (Signed)
Patient was here to do a BP recheck patient last BP was 153/93 and today it was 130/78. Patient states he is currently running more and eating better.

## 2021-10-01 ENCOUNTER — Encounter: Payer: Self-pay | Admitting: Registered Nurse

## 2021-10-06 ENCOUNTER — Other Ambulatory Visit: Payer: Self-pay | Admitting: Registered Nurse

## 2021-11-07 ENCOUNTER — Telehealth: Payer: 59 | Admitting: Family

## 2021-11-07 DIAGNOSIS — M5442 Lumbago with sciatica, left side: Secondary | ICD-10-CM

## 2021-11-07 MED ORDER — NAPROXEN 500 MG PO TABS: 500.0000 mg | ORAL_TABLET | Freq: Two times a day (BID) | ORAL | 0 refills | Status: DC

## 2021-11-07 MED ORDER — BACLOFEN 10 MG PO TABS: 10.0000 mg | ORAL_TABLET | Freq: Three times a day (TID) | ORAL | 0 refills | Status: DC

## 2021-11-07 NOTE — Progress Notes (Signed)

## 2021-12-07 ENCOUNTER — Other Ambulatory Visit: Payer: Self-pay

## 2021-12-07 ENCOUNTER — Encounter: Payer: Self-pay | Admitting: Registered Nurse

## 2021-12-07 ENCOUNTER — Ambulatory Visit (INDEPENDENT_AMBULATORY_CARE_PROVIDER_SITE_OTHER): Payer: 59 | Admitting: Registered Nurse

## 2021-12-07 VITALS — BP 140/82 | HR 80 | Temp 97.6°F | Resp 19 | Ht 69.0 in | Wt 302.4 lb

## 2021-12-07 DIAGNOSIS — Z9189 Other specified personal risk factors, not elsewhere classified: Secondary | ICD-10-CM

## 2021-12-07 DIAGNOSIS — Z6841 Body Mass Index (BMI) 40.0 and over, adult: Secondary | ICD-10-CM

## 2021-12-07 NOTE — Patient Instructions (Signed)
Mr. Jackson Wallace -   Doristine Devoid to see you  I have placed referral  Call with any concerns  Thanks,  Denice Paradise

## 2021-12-07 NOTE — Progress Notes (Signed)
Established Patient Office Visit  Subjective:  Patient ID: Jackson Wallace, male    DOB: 26-Mar-1991  Age: 31 y.o. 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   Noted to have BMI of 44 and neck circumference >17"  No symptoms of concern at this time  Outpatient Medications Prior to Visit  Medication Sig Dispense Refill   acetic acid-hydrocortisone (VOSOL-HC) OTIC solution Place 3 drops into both ears 3 (three) times daily. 10 mL 0   baclofen (LIORESAL) 10 MG tablet Take 1 tablet (10 mg total) by mouth 3 (three) times daily. 30 each 0   cetirizine (ZYRTEC) 10 MG tablet TAKE 1 TABLET(10 MG) BY MOUTH DAILY 30 tablet 1   fluticasone (FLONASE) 50 MCG/ACT nasal spray Place 2 sprays into both nostrils daily. 16 g 6   losartan-hydrochlorothiazide (HYZAAR) 50-12.5 MG tablet Take 1 tablet by mouth daily. 90 tablet 1   meclizine (ANTIVERT) 50 MG tablet Take 0.5 tablets (25 mg total) by mouth 2 (two) times daily as needed. 30 tablet 0   naproxen (NAPROSYN) 500 MG tablet Take 1 tablet (500 mg total) by mouth 2 (two) times daily with a meal. 30 tablet 0   Vitamin D, Ergocalciferol, (DRISDOL) 1.25 MG (50000 UNIT) CAPS capsule Take 1 capsule (50,000 Units total) by mouth every 7 (seven) days. 12 capsule 0   CIPRODEX OTIC suspension Place 4 drops into both ears 2 (two) times daily. (Patient not taking: Reported on 12/07/2021)     No facility-administered medications prior to visit.    Review of Systems  Constitutional: Negative.   HENT: Negative.    Eyes: Negative.   Respiratory: Negative.    Cardiovascular: Negative.   Gastrointestinal: Negative.   Genitourinary: Negative.   Musculoskeletal: Negative.   Skin:  Negative.   Neurological: Negative.   Psychiatric/Behavioral: Negative.    All other systems reviewed and are negative.    Objective:     BP 140/82   Pulse 80   Temp 97.6 F (36.4 C) (Temporal)   Resp 19   Ht 5\' 9"  (1.753 m)   Wt (!) 302 lb 6.4 oz (137.2 kg)   SpO2 98%   BMI 44.66 kg/m   Wt Readings from Last 3 Encounters:  12/07/21 (!) 302 lb 6.4 oz (137.2 kg)  07/13/21 299 lb 6.4 oz (135.8 kg)  12/01/20 296 lb 12.8 oz (134.6 kg)   Physical Exam Constitutional:      General: He is not in acute distress.    Appearance: Normal appearance. He is normal weight. He is not ill-appearing, toxic-appearing or diaphoretic.  Cardiovascular:     Rate and Rhythm: Normal rate and regular rhythm.     Heart sounds: Normal heart sounds. No murmur heard.   No friction rub. No gallop.  Pulmonary:     Effort: Pulmonary effort is normal. No respiratory distress.     Breath sounds: Normal breath sounds. No stridor. No wheezing, rhonchi or rales.  Chest:     Chest wall: No tenderness.  Neurological:     General: No focal deficit present.     Mental Status: He is alert and oriented to person, place, and time. Mental status is  at baseline.  Psychiatric:        Mood and Affect: Mood normal.        Behavior: Behavior normal.        Thought Content: Thought content normal.        Judgment: Judgment normal.    No results found for any visits on 12/07/21.    The ASCVD Risk score (Arnett DK, et al., 2019) failed to calculate for the following reasons:   The 2019 ASCVD risk score is only valid for ages 23 to 14    Assessment & Plan:   Problem List Items Addressed This Visit   None Visit Diagnoses     At risk for apnea    -  Primary   Relevant Orders   Ambulatory referral to Pulmonology   Morbid obesity with BMI of 40.0-44.9, adult [facility])       Relevant Orders   Ambulatory referral to Pulmonology       No orders of the defined types were placed in this encounter.   Return if  symptoms worsen or fail to improve.   PLAN Refer to pulmonary for sleep study Follow up as indicated Patient encouraged to call clinic with any questions, comments, or concerns.   Jackson Agee, NP

## 2021-12-15 ENCOUNTER — Encounter: Payer: Self-pay | Admitting: Primary Care

## 2021-12-15 ENCOUNTER — Ambulatory Visit (INDEPENDENT_AMBULATORY_CARE_PROVIDER_SITE_OTHER): Payer: 59 | Admitting: Primary Care

## 2021-12-15 VITALS — BP 122/76 | HR 103 | Temp 97.4°F | Ht 71.0 in | Wt 302.2 lb

## 2021-12-15 DIAGNOSIS — Z9189 Other specified personal risk factors, not elsewhere classified: Secondary | ICD-10-CM | POA: Diagnosis not present

## 2021-12-15 DIAGNOSIS — R0683 Snoring: Secondary | ICD-10-CM

## 2021-12-15 NOTE — Progress Notes (Signed)
@Patient  ID: , male    DOB: January 11, 1991, 31 y.o.   MRN: [id number]  Chief Complaint  Patient presents with   Consult    Referred to see if he has OSA. Some snoring when exhausted.    Referring provider: [id number], NP  HPI: 31 year old male, never smoked. Past medical history of obesity and high blood pressure.    12/15/2021- Interim hx  Patient presents today for sleep consult.  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  He is at risk for sleep apnea d/t obesity and neck circumference greater than 17 inches. His DOT physical was last week, he has been cleared for 3 months until sleep study has been completed. He has been told by his girlfriend that he snores.  Typical bedtime is 11 PM.  Does not take him long to fall asleep.  He wakes up on average once a night.  He starts his day between 630 and 7 AM.  His weight is up 20 pounds in the last 2 years.  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  No previous sleep study.  He does not wear CPAP or oxygen.  Epworth score is 3/24.  No symptoms of narcolepsy, cataplexy or sleepwalking.  Sleep questionnaire Symptoms-  Occasional loud snoring, neck circumference >17 inches Prior sleep study- None  Bedtime- 11pm Time to fall asleep- 5 mins  Nocturnal awakenings- once Out of bed/start of day- 6:30-7am  Weight changes- up 20 lbs  Do you operate heavy machinery- Yes Do you currently wear CPAP- No Do you current wear oxygen- No Epworth- 3  Allergies  Allergen Reactions   Shrimp [Shellfish Allergy]     Immunization History  Administered Date(s) Administered   DTaP 12/20/2016   HPV 9-valent 12/20/2016   Hpv-Unspecified 12/20/2016   Influenza,inj,Quad PF,6+ Mos 04/26/2019   Meningococcal Conjugate 12/20/2016   Meningococcal Mcv4o 12/20/2016   Tdap 12/20/2016    Past Medical History:  Diagnosis Date   Allergy     Tobacco History: Social History    Tobacco Use  Smoking Status Never  Smokeless Tobacco Never   Counseling given: Not Answered   Outpatient Medications Prior to Visit  Medication Sig Dispense Refill   cetirizine (ZYRTEC) 10 MG tablet TAKE 1 TABLET(10 MG) BY MOUTH DAILY 30 tablet 1   fluticasone (FLONASE) 50 MCG/ACT nasal spray Place 2 sprays into both nostrils daily. 16 g 6   Vitamin D, Ergocalciferol, (DRISDOL) 1.25 MG (50000 UNIT) CAPS capsule Take 1 capsule (50,000 Units total) by mouth every 7 (seven) days. 12 capsule 0   acetic acid-hydrocortisone (VOSOL-HC) OTIC solution Place 3 drops into both ears 3 (three) times daily. (Patient not taking: Reported on 12/15/2021) 10 mL 0   baclofen (LIORESAL) 10 MG tablet Take 1 tablet (10 mg total) by mouth 3 (three) times daily. (Patient not taking: Reported on 12/15/2021) 30 each 0   CIPRODEX OTIC suspension Place 4 drops into both ears 2 (two) times daily. (Patient not taking: Reported on 12/07/2021)     losartan-hydrochlorothiazide (HYZAAR) 50-12.5 MG tablet Take 1 tablet by mouth daily. 90 tablet 1   meclizine (ANTIVERT) 50 MG tablet Take 0.5 tablets (25 mg total) by mouth 2 (two) times daily as needed. 30 tablet 0   naproxen (NAPROSYN) 500 MG tablet Take 1 tablet (500 mg total) by mouth 2 (two) times daily with a meal. (Patient not taking: Reported on 12/15/2021) 30 tablet 0  No facility-administered medications prior to visit.   Review of Systems  Review of Systems  Constitutional: Negative.   HENT: Negative.    Respiratory: Negative.    Cardiovascular: Negative.   Psychiatric/Behavioral: Negative.      Physical Exam  BP 122/76 (BP Location: Left Arm, Patient Position: Sitting)   Pulse (!) 103   Temp (!) 97.4 F (36.3 C) (Oral)   Ht 5\' 11"  (1.803 m)   Wt (!) 302 lb 3.2 oz (137.1 kg)   SpO2 98% Comment: RA  BMI 42.15 kg/m  Physical Exam Constitutional:      Appearance: Normal appearance. He is obese.  HENT:     Head: Normocephalic and atraumatic.      Mouth/Throat:     Mouth: Mucous membranes are moist.     Pharynx: Oropharynx is clear.     Comments: Mallampati class II-III Cardiovascular:     Rate and Rhythm: Normal rate and regular rhythm.  Pulmonary:     Effort: Pulmonary effort is normal.     Breath sounds: Normal breath sounds.  Musculoskeletal:     Cervical back: Normal range of motion and neck supple.  Skin:    General: Skin is warm and dry.  Neurological:     General: No focal deficit present.     Mental Status: He is alert and oriented to person, place, and time. Mental status is at baseline.  Psychiatric:        Mood and Affect: Mood normal.        Behavior: Behavior normal.        Judgment: Judgment normal.     Lab Results:  CBC    Component Value Date/Time   WBC 8.7 10/23/2020 1119   RBC 4.96 10/23/2020 1119   HGB 13.2 10/23/2020 1119   HGB 13.9 04/26/2019 1558   HCT 40.0 10/23/2020 1119   HCT 42.1 04/26/2019 1558   PLT 340 10/23/2020 1119   PLT 406 04/26/2019 1558   MCV 80.6 10/23/2020 1119   MCV 82 04/26/2019 1558   MCH 26.6 10/23/2020 1119   MCHC 33.0 10/23/2020 1119   RDW 13.5 10/23/2020 1119   RDW 13.8 04/26/2019 1558   LYMPHSABS 1.9 04/26/2019 1558   MONOABS 0.6 06/22/2011 1356   EOSABS 0.2 04/26/2019 1558   BASOSABS 0.0 04/26/2019 1558    BMET    Component Value Date/Time   NA 137 12/01/2020 0914   NA 138 09/24/2019 0837   K 4.3 12/01/2020 0914   CL 99 12/01/2020 0914   CO2 28 12/01/2020 0914   GLUCOSE 101 (H) 12/01/2020 0914   BUN 14 12/01/2020 0914   BUN 20 09/24/2019 0837   CREATININE 0.63 12/01/2020 0914   CALCIUM 9.5 12/01/2020 0914   GFRNONAA >60 10/23/2020 1119   GFRAA 142 09/24/2019 0837    BNP No results found for: BNP  ProBNP No results found for: PROBNP  Imaging: No results found.   Assessment & Plan:   Loud snoring -Patient has symptoms occasional loud snoring. He is at risk for sleep apnea d/t obesity and neck circumference >17 inches.  Epworth 3. BMI 42.   Concern patient could have obstructive sleep apnea, needs home sleep study to evaluate. Discussed risk of untreated sleep apnea including cardiac arrhythmias, pulm HTN, stroke, DM. We briefly reviewed treatment options. Encouraged patient to work on weight loss efforts and focus on side sleeping position/elevate head of bed. Advised against driving if experiencing excessive daytime sleepiness. Follow-up in 4-6 weeks to review sleep  study results and discuss treatment options further   Glenford BayleyElizabeth W Paxtyn Wisdom, NP 12/15/2021

## 2021-12-15 NOTE — Progress Notes (Signed)
Reviewed and agree with assessment/plan.   Chesley Mires, MD [facility] Pulmonary/Critical Care 12/15/2021, 11:15 AM Pager:  [phone]

## 2021-12-15 NOTE — Patient Instructions (Addendum)

## 2021-12-15 NOTE — Assessment & Plan Note (Signed)
-  Patient has symptoms occasional loud snoring. He is at risk for sleep apnea d/t obesity and neck circumference >17 inches.  Epworth 3. BMI 42.  Concern patient could have obstructive sleep apnea, needs home sleep study to evaluate. Discussed risk of untreated sleep apnea including cardiac arrhythmias, pulm HTN, stroke, DM. We briefly reviewed treatment options. Encouraged patient to work on weight loss efforts and focus on side sleeping position/elevate head of bed. Advised against driving if experiencing excessive daytime sleepiness. Follow-up in 4-6 weeks to review sleep study results and discuss treatment options further

## 2022-01-04 ENCOUNTER — Ambulatory Visit (INDEPENDENT_AMBULATORY_CARE_PROVIDER_SITE_OTHER): Payer: 59 | Admitting: Registered Nurse

## 2022-01-04 ENCOUNTER — Encounter: Payer: Self-pay | Admitting: Registered Nurse

## 2022-01-04 VITALS — BP 130/74 | HR 87 | Temp 98.2°F | Resp 16 | Ht 71.0 in | Wt 306.8 lb

## 2022-01-04 DIAGNOSIS — Z13228 Encounter for screening for other metabolic disorders: Secondary | ICD-10-CM

## 2022-01-04 DIAGNOSIS — E559 Vitamin D deficiency, unspecified: Secondary | ICD-10-CM

## 2022-01-04 DIAGNOSIS — Z130 Encounter for screening for diseases of the blood and blood-forming organs and certain disorders involving the immune mechanism: Secondary | ICD-10-CM

## 2022-01-04 DIAGNOSIS — Z0000 Encounter for general adult medical examination without abnormal findings: Secondary | ICD-10-CM

## 2022-01-04 DIAGNOSIS — Z1329 Encounter for screening for other suspected endocrine disorder: Secondary | ICD-10-CM

## 2022-01-04 DIAGNOSIS — Z13220 Encounter for screening for lipoid disorders: Secondary | ICD-10-CM

## 2022-01-05 ENCOUNTER — Other Ambulatory Visit: Payer: Self-pay | Admitting: Registered Nurse

## 2022-01-05 DIAGNOSIS — E559 Vitamin D deficiency, unspecified: Secondary | ICD-10-CM

## 2022-01-05 LAB — LIPID PANEL
Cholesterol: 212 mg/dL — ABNORMAL HIGH (ref 0–200)
HDL: 34.2 mg/dL — ABNORMAL LOW (ref >39.00)
NonHDL: 177.47
Total CHOL/HDL Ratio: 6
Triglycerides: 376 mg/dL — ABNORMAL HIGH (ref 0.0–149.0)
VLDL: 75.2 mg/dL — ABNORMAL HIGH (ref 0.0–40.0)

## 2022-01-05 LAB — COMPREHENSIVE METABOLIC PANEL
ALT: 39 U/L (ref 0–53)
AST: 28 U/L (ref 0–37)
Albumin: 4.7 g/dL (ref 3.5–5.2)
Alkaline Phosphatase: 106 U/L (ref 39–117)
BUN: 16 mg/dL (ref 6–23)
CO2: 26 mEq/L (ref 19–32)
Calcium: 10.1 mg/dL (ref 8.4–10.5)
Chloride: 100 mEq/L (ref 96–112)
Creatinine, Ser: 0.88 mg/dL (ref 0.40–1.50)
GFR: 116.21 mL/min (ref >60.00)
Glucose, Bld: 72 mg/dL (ref 70–99)
Potassium: 3.9 mEq/L (ref 3.5–5.1)
Sodium: 136 mEq/L (ref 135–145)
Total Bilirubin: 0.3 mg/dL (ref 0.2–1.2)
Total Protein: 8.3 g/dL (ref 6.0–8.3)

## 2022-01-05 LAB — HEMOGLOBIN A1C: Hgb A1c MFr Bld: 6.3 % (ref 4.6–6.5)

## 2022-01-05 LAB — VITAMIN D 25 HYDROXY (VIT D DEFICIENCY, FRACTURES): VITD: 19.47 ng/mL — ABNORMAL LOW (ref 30.00–100.00)

## 2022-01-05 LAB — LDL CHOLESTEROL, DIRECT: Direct LDL: 139 mg/dL

## 2022-01-05 MED ORDER — VITAMIN D (ERGOCALCIFEROL) 1.25 MG (50000 UNIT) PO CAPS: 50000.0000 [IU] | ORAL_CAPSULE | ORAL | 0 refills | Status: DC

## 2022-01-05 NOTE — Progress Notes (Signed)
Complete physical exam  Patient: Jackson Wallace   DOB: Sep 28, 1990   30 y.o. Male  MRN: [id number] Visit Date: 01/05/2022  Subjective:    Chief Complaint  Patient presents with   Annual Exam    Pt here for annual no concerns     Jackson Wallace is a 31 y.o. male who presents today for a complete physical exam. He reports consuming a general diet.     He generally feels well. He reports sleeping well. He does not have additional problems to discuss today.   Vision:Within the last year Dental:Within Last 6 months STD Screen:No PSA:No Most recent fall risk assessment:    01/04/2022    2:21 PM  Fall Risk   Falls in the past year? 0  Number falls in past yr: 0  Injury with Fall? 0  Risk for fall due to : No Fall Risks  Follow up Falls evaluation completed     Most recent depression screenings:    01/04/2022    2:22 PM 01/04/2022    2:21 PM  PHQ 2/9 Scores  PHQ - 2 Score 0 0     Patient Active Problem List   Diagnosis Date Noted   Loud snoring 12/15/2021   HPV vaccine counseling 12/20/2016   Annual physical exam 12/20/2016   Past Medical History:  Diagnosis Date   Allergy    History reviewed. No pertinent surgical history. Social History   Tobacco Use   Smoking status: Never   Smokeless tobacco: Never  Vaping Use   Vaping Use: Never used  Substance Use Topics   Alcohol use: No   Drug use: No   Social History   Socioeconomic History   Marital status: Significant Other    Spouse name: Not on file   Number of children: 0   Years of education: Not on file   Highest education level: Not on file  Occupational History   Occupation: driver  Tobacco Use   Smoking status: Never   Smokeless tobacco: Never  Vaping Use   Vaping Use: Never used  Substance and Sexual Activity   Alcohol use: No   Drug use: No   Sexual activity: Not Currently  Other Topics Concern   Not on file  Social History Narrative   Not on file   Social  Determinants of Health   Financial Resource Strain: Low Risk  (04/26/2019)   Overall Financial Resource Strain (CARDIA)    Difficulty of Paying Living Expenses: Not hard at all  Food Insecurity: No Food Insecurity (04/26/2019)   Hunger Vital Sign    Worried About Running Out of Food in the Last Year: Never true    Ran Out of Food in the Last Year: Never true  Transportation Needs: No Transportation Needs (04/26/2019)   PRAPARE - Administrator, Civil Service (Medical): No    Lack of Transportation (Non-Medical): No  Physical Activity: Insufficiently Active (04/26/2019)   Exercise Vital Sign    Days of Exercise per Week: 3 days    Minutes of Exercise per Session: 30 min  Stress: No Stress Concern Present (04/26/2019)   [employer] of Occupational Health - Occupational Stress Questionnaire    Feeling of Stress : Not at all  Social Connections: Unknown (04/26/2019)   Social Connection and Isolation Panel [NHANES]    Frequency of Communication with Friends and Family: Three times a week    Frequency of Social Gatherings with Friends and Family: Twice a  week    Attends Religious Services: Patient refused    Active Member of Clubs or Organizations: Patient refused    Attends Banker Meetings: Patient refused    Marital Status: Living with partner  Intimate Partner Violence: Not At Risk (04/26/2019)   Humiliation, Afraid, Rape, and Kick questionnaire    Fear of Current or Ex-Partner: No    Emotionally Abused: No    Physically Abused: No    Sexually Abused: No   Family Status  Relation Name Status   Mother  Alive   Father  Alive   MGM  Alive   MGF  Alive   PGM  Alive   PGF  Alive   Family History  Problem Relation Age of Onset   Diabetes Maternal Grandmother    Allergies  Allergen Reactions   Shrimp [Shellfish Allergy]      Patient Care Team: Janeece Agee, NP as PCP - General (Adult Health Nurse Practitioner)   Medications: Outpatient  Medications Prior to Visit  Medication Sig   cetirizine (ZYRTEC) 10 MG tablet TAKE 1 TABLET(10 MG) BY MOUTH DAILY   fluticasone (FLONASE) 50 MCG/ACT nasal spray Place 2 sprays into both nostrils daily.   losartan-hydrochlorothiazide (HYZAAR) 50-12.5 MG tablet Take 1 tablet by mouth daily.   Vitamin D, Ergocalciferol, (DRISDOL) 1.25 MG (50000 UNIT) CAPS capsule Take 1 capsule (50,000 Units total) by mouth every 7 (seven) days.   acetic acid-hydrocortisone (VOSOL-HC) OTIC solution Place 3 drops into both ears 3 (three) times daily. (Patient not taking: Reported on 12/15/2021)   baclofen (LIORESAL) 10 MG tablet Take 1 tablet (10 mg total) by mouth 3 (three) times daily. (Patient not taking: Reported on 12/15/2021)   CIPRODEX OTIC suspension Place 4 drops into both ears 2 (two) times daily. (Patient not taking: Reported on 12/07/2021)   meclizine (ANTIVERT) 50 MG tablet Take 0.5 tablets (25 mg total) by mouth 2 (two) times daily as needed. (Patient not taking: Reported on 01/04/2022)   naproxen (NAPROSYN) 500 MG tablet Take 1 tablet (500 mg total) by mouth 2 (two) times daily with a meal. (Patient not taking: Reported on 12/15/2021)   No facility-administered medications prior to visit.    Review of Systems  Constitutional: Negative.   HENT: Negative.    Eyes: Negative.   Respiratory: Negative.    Cardiovascular: Negative.   Gastrointestinal: Negative.   Genitourinary: Negative.   Musculoskeletal: Negative.   Skin: Negative.   Neurological: Negative.   Psychiatric/Behavioral: Negative.    All other systems reviewed and are negative.   Last CBC Lab Results  Component Value Date   WBC 8.7 10/23/2020   HGB 13.2 10/23/2020   HCT 40.0 10/23/2020   MCV 80.6 10/23/2020   MCH 26.6 10/23/2020   RDW 13.5 10/23/2020   PLT 340 10/23/2020   Last metabolic panel Lab Results  Component Value Date   GLUCOSE 101 (H) 12/01/2020   NA 137 12/01/2020   K 4.3 12/01/2020   CL 99 12/01/2020   CO2 28  12/01/2020   BUN 14 12/01/2020   CREATININE 0.63 12/01/2020   GFRNONAA >60 10/23/2020   CALCIUM 9.5 12/01/2020   PROT 7.5 12/01/2020   ALBUMIN 4.5 12/01/2020   LABGLOB 3.0 09/24/2019   AGRATIO 1.5 09/24/2019   BILITOT 0.5 12/01/2020   ALKPHOS 107 12/01/2020   AST 20 12/01/2020   ALT 30 12/01/2020   ANIONGAP 9 10/23/2020   Last lipids Lab Results  Component Value Date   CHOL 191 07/13/2021  HDL 33.50 (L) 07/13/2021   LDLCALC 120 (H) 09/24/2019   LDLDIRECT 130.0 07/13/2021   TRIG 229.0 (H) 07/13/2021   CHOLHDL 6 07/13/2021   Last hemoglobin A1c Lab Results  Component Value Date   HGBA1C 6.1 11/03/2020   Last thyroid functions Lab Results  Component Value Date   TSH 1.85 12/01/2020   Last vitamin D Lab Results  Component Value Date   VD25OH 14.73 (L) 07/13/2021   Last vitamin B12 and Folate Lab Results  Component Value Date   VITAMINB12 520 11/03/2020        Objective:     BP 130/74   Pulse 87   Temp 98.2 F (36.8 C) (Temporal)   Resp 16   Ht 5\' 11"  (1.803 m)   Wt (!) 306 lb 12.8 oz (139.2 kg)   SpO2 97%   BMI 42.79 kg/m   BP Readings from Last 3 Encounters:  01/04/22 130/74  12/15/21 122/76  12/07/21 140/82   Wt Readings from Last 3 Encounters:  01/04/22 (!) 306 lb 12.8 oz (139.2 kg)  12/15/21 (!) 302 lb 3.2 oz (137.1 kg)  12/07/21 (!) 302 lb 6.4 oz (137.2 kg)   SpO2 Readings from Last 3 Encounters:  01/04/22 97%  12/15/21 98%  12/07/21 98%      Physical Exam Vitals and nursing note reviewed.  Constitutional:      General: He is not in acute distress.    Appearance: Normal appearance. He is obese. He is not ill-appearing, toxic-appearing or diaphoretic.  HENT:     Head: Normocephalic and atraumatic.     Right Ear: Tympanic membrane, ear canal and external ear normal. There is no impacted cerumen.     Left Ear: Tympanic membrane, ear canal and external ear normal. There is no impacted cerumen.     Nose: Nose normal. No congestion or  rhinorrhea.     Mouth/Throat:     Mouth: Mucous membranes are moist.     Pharynx: Oropharynx is clear. No oropharyngeal exudate or posterior oropharyngeal erythema.  Eyes:     General: No scleral icterus.       Right eye: No discharge.        Left eye: No discharge.     Extraocular Movements: Extraocular movements intact.     Conjunctiva/sclera: Conjunctivae normal.     Pupils: Pupils are equal, round, and reactive to light.  Neck:     Vascular: No carotid bruit.  Cardiovascular:     Rate and Rhythm: Normal rate and regular rhythm.     Pulses: Normal pulses.     Heart sounds: Normal heart sounds. No murmur heard.    No friction rub. No gallop.  Pulmonary:     Effort: Pulmonary effort is normal. No respiratory distress.     Breath sounds: Normal breath sounds. No stridor. No wheezing, rhonchi or rales.  Chest:     Chest wall: No tenderness.  Abdominal:     General: Abdomen is flat. Bowel sounds are normal. There is no distension.     Palpations: Abdomen is soft. There is no mass.     Tenderness: There is no abdominal tenderness. There is no right CVA tenderness, left CVA tenderness, guarding or rebound.     Hernia: No hernia is present.  Musculoskeletal:        General: No swelling, tenderness, deformity or signs of injury. Normal range of motion.     Cervical back: Normal range of motion and neck supple. No rigidity or tenderness.  Right lower leg: No edema.     Left lower leg: No edema.  Lymphadenopathy:     Cervical: No cervical adenopathy.  Skin:    General: Skin is warm and dry.     Capillary Refill: Capillary refill takes less than 2 seconds.     Coloration: Skin is not jaundiced or pale.     Findings: No bruising, erythema, lesion or rash.  Neurological:     General: No focal deficit present.     Mental Status: He is alert and oriented to person, place, and time. Mental status is at baseline.     Cranial Nerves: No cranial nerve deficit.     Sensory: No sensory  deficit.     Motor: No weakness.     Coordination: Coordination normal.     Gait: Gait normal.     Deep Tendon Reflexes: Reflexes normal.  Psychiatric:        Mood and Affect: Mood normal.        Behavior: Behavior normal.        Thought Content: Thought content normal.        Judgment: Judgment normal.      No results found for any visits on 01/04/22.    Assessment & Plan:    Routine Health Maintenance and Physical Exam  Immunization History  Administered Date(s) Administered   DTaP 12/20/2016   HPV 9-valent 12/20/2016   Hpv-Unspecified 12/20/2016   Influenza,inj,Quad PF,6+ Mos 04/26/2019   Meningococcal Conjugate 12/20/2016   Meningococcal Mcv4o 12/20/2016   Tdap 12/20/2016    Health Maintenance  Topic Date Due   COVID-19 Vaccine (1) 01/20/2022 (Originally 01/03/1993)   Hepatitis C Screening  12/08/2022 (Originally 07/05/2010)   HPV VACCINES (2 - Male 3-dose series) 01/05/2023 (Originally 01/17/2017)   INFLUENZA VACCINE  02/08/2022   TETANUS/TDAP  12/21/2026   HIV Screening  Completed    Discussed health benefits of physical activity, and encouraged him to engage in regular exercise appropriate for his age and condition.  Problem List Items Addressed This Visit       Other   Annual physical exam - Primary   Other Visit Diagnoses     Screening for endocrine, metabolic and immunity disorder       Relevant Orders   Comprehensive metabolic panel   Hemoglobin A1c   Lipid screening       Relevant Orders   Lipid panel   Vitamin D deficiency       Relevant Orders   VITAMIN D 25 Hydroxy (Vit-D Deficiency, Fractures)      Return in about 1 year (around 01/05/2023) for CPE and labs.     PLAN Exam unremarkable Labs collected. Will follow up with the patient as warranted. Return annually Patient encouraged to call clinic with any questions, comments, or concerns.   Maximiano Coss, NP

## 2022-01-10 ENCOUNTER — Encounter: Payer: 59 | Admitting: Registered Nurse

## 2022-01-10 ENCOUNTER — Encounter: Payer: Self-pay | Admitting: Registered Nurse

## 2022-01-26 ENCOUNTER — Encounter: Payer: Self-pay | Admitting: Registered Nurse

## 2022-02-18 ENCOUNTER — Ambulatory Visit (INDEPENDENT_AMBULATORY_CARE_PROVIDER_SITE_OTHER): Payer: 59 | Admitting: Primary Care

## 2022-02-18 DIAGNOSIS — R0683 Snoring: Secondary | ICD-10-CM

## 2022-02-18 DIAGNOSIS — Z9189 Other specified personal risk factors, not elsewhere classified: Secondary | ICD-10-CM

## 2022-03-02 ENCOUNTER — Ambulatory Visit: Payer: 59

## 2022-03-02 DIAGNOSIS — G4733 Obstructive sleep apnea (adult) (pediatric): Secondary | ICD-10-CM | POA: Diagnosis not present

## 2022-03-03 ENCOUNTER — Telehealth: Payer: Self-pay | Admitting: Primary Care

## 2022-03-03 DIAGNOSIS — G4733 Obstructive sleep apnea (adult) (pediatric): Secondary | ICD-10-CM | POA: Diagnosis not present

## 2022-03-03 NOTE — Telephone Encounter (Signed)
Routing to [employer]. Beth, please give form to nurse after completed and have them call pt so pt is aware. Thank you!

## 2022-03-04 NOTE — Telephone Encounter (Signed)
Could not locate the form. Checked with Wendie Simmer nurse who states BW filled out the form and signed it yesterday 8/24 before leaving clinic. Nothing further needed.

## 2022-03-04 NOTE — Progress Notes (Signed)
HST on 03/02/22 showed severe OSA, needs virtual visit to review results and treatment options

## 2022-03-16 ENCOUNTER — Telehealth (INDEPENDENT_AMBULATORY_CARE_PROVIDER_SITE_OTHER): Payer: 59 | Admitting: Primary Care

## 2022-03-16 DIAGNOSIS — R0683 Snoring: Secondary | ICD-10-CM

## 2022-03-16 NOTE — Telephone Encounter (Signed)
Did we mail this to patient, patient was asking about form today during televisit

## 2022-03-16 NOTE — Progress Notes (Signed)
Reviewed and agree with assessment/plan.   Coralyn Helling, MD [facility] Pulmonary/Critical Care 03/16/2022, 12:30 PM Pager:  [phone]

## 2022-03-16 NOTE — Progress Notes (Signed)
Virtual Visit via Video Note  I connected with Jackson Wallace on 03/16/22 at 12:00 PM EDT by a video enabled telemedicine application and verified that I am speaking with the correct person using two identifiers.  Location: Patient: Home Provider: Office    I discussed the limitations of evaluation and management by telemedicine and the availability of in person appointments. The patient expressed understanding and agreed to proceed.  History of Present Illness: 31 year old male, never smoked. Past medical history of obesity and high blood pressure.    Previous [facility] pulmonary: 12/15/2021 Patient presents today for sleep consult.  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  He is at risk for sleep apnea d/t obesity and neck circumference greater than 17 inches. His DOT physical was last week, he has been cleared for 3 months until sleep study has been completed. He has been told by his girlfriend that he snores.  Typical bedtime is 11 PM.  Does not take him long to fall asleep.  He wakes up on average once a night.  He starts his day between 630 and 7 AM.  His weight is up 20 pounds in the last 2 years.  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  No previous sleep study.  He does not wear CPAP or oxygen.  Epworth score is 3/24.  No symptoms of narcolepsy, cataplexy or sleepwalking.  Sleep questionnaire Symptoms-  Occasional loud snoring, neck circumference >17 inches Prior sleep study- None  Bedtime- 11pm Time to fall asleep- 5 mins  Nocturnal awakenings- once Out of bed/start of day- 6:30-7am  Weight changes- up 20 lbs  Do you operate heavy machinery- Yes Do you currently wear CPAP- No Do you current wear oxygen- No Epworth- 3  03/16/2022- Interim hx  Patient contacted today to review sleep study results. Patient has symptoms of loud snoring. No significant daytime sleepiness. At risk for OSA s/t obesity. He had a home sleep study on  03/02/2022 that showed evidence of severe obstructive sleep apnea, AHI 40.3 an hour with SPO2 low 60% (average 94%).  Due to severity of sleep apnea and clinical symptoms recommending patient be started on auto CPAP.  He is agreement with plan.  He is motivated to lose weight and his goal weight is 185 pounds. We recently filled out a form for DOT/letter for work.  He will need a follow-up 1 to 3 months after starting CPAP for compliance check.  Observations/Objective:  - Appears well; No overt respiratory symptoms  Assessment and Plan:  Severe OSA: -Patient has symptoms of loud snoring.  No associated daytime sleepiness.  Epworth score is 3.  Patient at risk for sleep apnea due to obesity.  Home sleep study on 03/02/2022 showed severe obstructive sleep apnea, AHI 40.3 an hour with SPO2 low 60% (average 94%). - Reviewed risks of untreated sleep apnea and treatment options.  The severity of OSA recommending patient be started on auto CPAP, patient in agreement with plan - DME order placed for auto CPAP 5 to 20 cm H2O with mask of choice - Patient to aim to wear CPAP every night for minimum of 4 to 6 hours or longer - Encourage weight loss efforts and advised against driving if experiencing excessive daytime sleepiness fatigue - Form for DOT/work was completed last week and mail to patient  Follow Up Instructions:   -31 to 90 days after starting CPAP for compliance check  I discussed the  assessment and treatment plan with the patient. The patient was provided an opportunity to ask questions and all were answered. The patient agreed with the plan and demonstrated an understanding of the instructions.   The patient was advised to call back or seek an in-person evaluation if the symptoms worsen or if the condition fails to improve as anticipated.  I provided 22 minutes of non-face-to-face time during this encounter.   Glenford Bayley, NP

## 2022-03-16 NOTE — Patient Instructions (Signed)
Sleep study showed severe sleep apnea, you had an average of 40 apneic events an hour Due to severity of sleep apnea recommending you be started on CPAP therapy Once you get CPAP and to wear every night for minimum 4 to 6 hours or longer Continue to work on weight loss efforts Not drive experiencing excessive daytime sleepiness or fatigue  Follow-up: 31 to 90 days after starting CPAP for compliance check  CPAP and BIPAP Information CPAP and BIPAP are methods that use air pressure to keep your airways open and to help you breathe well. CPAP and BIPAP use different amounts of pressure. Your health care provider will tell you whether CPAP or BIPAP would be more helpful for you. CPAP stands for "continuous positive airway pressure." With CPAP, the amount of pressure stays the same while you breathe in (inhale) and out (exhale). BIPAP stands for "bi-level positive airway pressure." With BIPAP, the amount of pressure will be higher when you inhale and lower when you exhale. This allows you to take larger breaths. CPAP or BIPAP may be used in the hospital, or your health care provider may want you to use it at home. You may need to have a sleep study before your health care provider can order a machine for you to use at home. What are the advantages? CPAP or BIPAP can be helpful if you have: Sleep apnea. Chronic obstructive pulmonary disease (COPD). Heart failure. Medical conditions that cause muscle weakness, including muscular dystrophy or amyotrophic lateral sclerosis (ALS). Other problems that cause breathing to be shallow, weak, abnormal, or difficult. CPAP and BIPAP are most commonly used for obstructive sleep apnea (OSA) to keep the airways from collapsing when the muscles relax during sleep. What are the risks? Generally, this is a safe treatment. However, problems may occur, including: Irritated skin or skin sores if the mask does not fit properly. Dry or stuffy nose or nosebleeds. Dry  mouth. Feeling gassy or bloated. Sinus or lung infection if the equipment is not cleaned properly. When should CPAP or BIPAP be used? In most cases, the mask only needs to be worn during sleep. Generally, the mask needs to be worn throughout the night and during any daytime naps. People with certain medical conditions may also need to wear the mask at other times, such as when they are awake. Follow instructions from your health care provider about when to use the machine. What happens during CPAP or BIPAP?  Both CPAP and BIPAP are provided by a small machine with a flexible plastic tube that attaches to a plastic mask that you wear. Air is blown through the mask into your nose or mouth. The amount of pressure that is used to blow the air can be adjusted on the machine. Your health care provider will set the pressure setting and help you find the best mask for you. Tips for using the mask Because the mask needs to be snug, some people feel trapped or closed-in (claustrophobic) when first using the mask. If you feel this way, you may need to get used to the mask. One way to do this is to hold the mask loosely over your nose or mouth and then gradually apply the mask more snugly. You can also gradually increase the amount of time that you use the mask. Masks are available in various types and sizes. If your mask does not fit well, talk with your health care provider about getting a different one. Some common types of masks include: Full  face masks, which fit over the mouth and nose. Nasal masks, which fit over the nose. Nasal pillow or prong masks, which fit into the nostrils. If you are using a mask that fits over your nose and you tend to breathe through your mouth, a chin strap may be applied to help keep your mouth closed. Use a skin barrier to protect your skin as told by your health care provider. Some CPAP and BIPAP machines have alarms that may sound if the mask comes off or develops a  leak. If you have trouble with the mask, it is very important that you talk with your health care provider about finding a way to make the mask easier to tolerate. Do not stop using the mask. There could be a negative impact on your health if you stop using the mask. Tips for using the machine Place your CPAP or BIPAP machine on a secure table or stand near an electrical outlet. Know where the on/off switch is on the machine. Follow instructions from your health care provider about how to set the pressure on your machine and when you should use it. Do not eat or drink while the CPAP or BIPAP machine is on. Food or fluids could get pushed into your lungs by the pressure of the CPAP or BIPAP. For home use, CPAP and BIPAP machines can be rented or purchased through home health care companies. Many different brands of machines are available. Renting a machine before purchasing may help you find out which particular machine works well for you. Your health insurance company may also decide which machine you may get. Keep the CPAP or BIPAP machine and attachments clean. Ask your health care provider for specific instructions. Check the humidifier if you have a dry stuffy nose or nosebleeds. Make sure it is working correctly. Follow these instructions at home: Take over-the-counter and prescription medicines only as told by your health care provider. Ask if you can take sinus medicine if your sinuses are blocked. Do not use any products that contain nicotine or tobacco. These products include cigarettes, chewing tobacco, and vaping devices, such as e-cigarettes. If you need help quitting, ask your health care provider. Keep all follow-up visits. This is important. Contact a health care provider if: You have redness or pressure sores on your head, face, mouth, or nose from the mask or head gear. You have trouble using the CPAP or BIPAP machine. You cannot tolerate wearing the CPAP or BIPAP mask. Someone  tells you that you snore even when wearing your CPAP or BIPAP. Get help right away if: You have trouble breathing. You feel confused. Summary CPAP and BIPAP are methods that use air pressure to keep your airways open and to help you breathe well. If you have trouble with the mask, it is very important that you talk with your health care provider about finding a way to make the mask easier to tolerate. Do not stop using the mask. There could be a negative impact to your health if you stop using the mask. Follow instructions from your health care provider about when to use the machine. This information is not intended to replace advice given to you by your health care provider. Make sure you discuss any questions you have with your health care provider. Document Revised: 02/03/2021 Document Reviewed: 06/05/2020 Elsevier Patient Education  2023 [employer].

## 2022-05-06 ENCOUNTER — Ambulatory Visit (INDEPENDENT_AMBULATORY_CARE_PROVIDER_SITE_OTHER): Payer: 59 | Admitting: Nurse Practitioner

## 2022-05-06 VITALS — BP 140/86 | HR 87 | Temp 98.4°F | Ht 71.0 in | Wt 300.2 lb

## 2022-05-06 DIAGNOSIS — Z3169 Encounter for other general counseling and advice on procreation: Secondary | ICD-10-CM | POA: Diagnosis not present

## 2022-05-06 DIAGNOSIS — E559 Vitamin D deficiency, unspecified: Secondary | ICD-10-CM

## 2022-05-06 DIAGNOSIS — R21 Rash and other nonspecific skin eruption: Secondary | ICD-10-CM | POA: Diagnosis not present

## 2022-05-06 DIAGNOSIS — G4733 Obstructive sleep apnea (adult) (pediatric): Secondary | ICD-10-CM

## 2022-05-06 DIAGNOSIS — Z6841 Body Mass Index (BMI) 40.0 and over, adult: Secondary | ICD-10-CM | POA: Diagnosis not present

## 2022-05-06 LAB — CBC
HCT: 39.9 % (ref 39.0–52.0)
Hemoglobin: 13.2 g/dL (ref 13.0–17.0)
MCHC: 33.1 g/dL (ref 30.0–36.0)
MCV: 79.6 fl (ref 78.0–100.0)
Platelets: 382 10*3/uL (ref 150.0–400.0)
RBC: 5.01 Mil/uL (ref 4.22–5.81)
RDW: 14.5 % (ref 11.5–15.5)
WBC: 7.9 10*3/uL (ref 4.0–10.5)

## 2022-05-06 LAB — TSH: TSH: 1.81 u[IU]/mL (ref 0.35–5.50)

## 2022-05-06 LAB — HEMOGLOBIN A1C: Hgb A1c MFr Bld: 6.2 % (ref 4.6–6.5)

## 2022-05-06 LAB — VITAMIN D 25 HYDROXY (VIT D DEFICIENCY, FRACTURES): VITD: 16.19 ng/mL — ABNORMAL LOW (ref 30.00–100.00)

## 2022-05-06 MED ORDER — TRIAMCINOLONE ACETONIDE 0.1 % EX CREA: 1.0000 | TOPICAL_CREAM | Freq: Two times a day (BID) | CUTANEOUS | 0 refills | Status: DC

## 2022-05-06 NOTE — Assessment & Plan Note (Signed)
We will check serum level, further recommendations may be based on results.  Patient to continue taking supplement.

## 2022-05-06 NOTE — Assessment & Plan Note (Signed)
Referral to urology made today.  Further recommendations may be made based upon these results.

## 2022-05-06 NOTE — Assessment & Plan Note (Signed)
Chronic, encouraged to follow-up with sleep specialist and to use CPAP once this becomes available to him.

## 2022-05-06 NOTE — Assessment & Plan Note (Signed)
Trial triamcinolone topical treatment, referral to dermatology made today as well.

## 2022-05-06 NOTE — Patient Instructions (Signed)
Calorie deficit: 1200 calories/day Track food: use my fitness pal Eat low glycemic index foods: 4 ounces of lean protein with each meal, nonstarchy vegetables You are doing great with water intake, keep it up Exercise 12min/week

## 2022-05-06 NOTE — Assessment & Plan Note (Addendum)
Labs ordered for further evaluation, patient educated on lifestyle modification aimed at helping him with weight loss.  He was provided handout regarding these.  Patient to follow-up in approximately 1 month.

## 2022-05-06 NOTE — Progress Notes (Addendum)
Established Patient Office Visit  Subjective   Patient ID: Jackson Wallace, male    DOB: 1991/05/22  Age: 31 y.o. MRN: [id number]  Chief Complaint  Patient presents with   Establish Care    Hypertension: Inconsistently taking losartan-hydrochlorothiazide 50-12.5 mg tablets daily.  Trying to lose weight currently and hoping that he can come off of the antihypertensive.  Reports having not taken it today.  Vitamin D deficiency: Currently on vitamin D3 5000 IUs by mouth daily.  Last serum level was <20.  Obesity/OSA: Would like to discuss weight loss.  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  Does not have much time for frequent follow-ups.  Has been evaluated for OSA and reports has been diagnosed with this but still awaiting CPAP.  Rash: Reports longstanding history of sensitive skin where he feels he has significant reactions to bug bites and easy bruising/tearing to skin with trauma.  Currently has a pruritic rash on his upper back.  Has used CeraVe lotion and wash.  No other treatments tried.   Infertility?:  Has been inconsistently attempting pregnancy with partner for the last 3 years.  No pregnancy has occurred.  Partner is undergoing evaluation for infertility, he would like evaluation for infertility as well.  Denies any concerns with erectile dysfunction.    Review of Systems  Respiratory:  Negative for shortness of breath.   Cardiovascular:  Negative for chest pain.  Neurological:  Negative for loss of consciousness.      Objective:     BP (!) 140/86   Pulse 87   Temp 98.4 F (36.9 C) (Oral)   Ht 5\' 11"  (1.803 m)   Wt (!) 300 lb 4 oz (136.2 kg)   SpO2 96%   BMI 41.88 kg/m  BP Readings from Last 3 Encounters:  05/06/22 (!) 140/86  01/04/22 130/74  12/15/21 122/76   Wt Readings from Last 3 Encounters:  05/06/22 (!) 300 lb 4 oz (136.2 kg)  01/04/22 (!) 306 lb 12.8 oz (139.2 kg)  12/15/21 (!) 302 lb 3.2 oz  (137.1 kg)      Physical Exam Vitals reviewed.  Constitutional:      Appearance: Normal appearance.  HENT:     Head: Normocephalic and atraumatic.  Cardiovascular:     Rate and Rhythm: Normal rate and regular rhythm.  Pulmonary:     Effort: Pulmonary effort is normal.     Breath sounds: Normal breath sounds.  Musculoskeletal:     Cervical back: Neck supple.  Skin:    General: Skin is warm and dry.       Neurological:     Mental Status: He is alert and oriented to person, place, and time.  Psychiatric:        Mood and Affect: Mood normal.        Behavior: Behavior normal.        Thought Content: Thought content normal.        Judgment: Judgment normal.      No results found for any visits on 05/06/22.    The ASCVD Risk score (Arnett DK, et al., 2019) failed to calculate for the following reasons:   The 2019 ASCVD risk score is only valid for ages 27 to 28    Assessment & Plan:   Problem List Items Addressed This Visit       Respiratory   OSA (obstructive sleep apnea)    Chronic, encouraged to follow-up with sleep  specialist and to use CPAP once this becomes available to him.        Musculoskeletal and Integument   Rash    Trial triamcinolone topical treatment, referral to dermatology made today as well.      Relevant Medications   triamcinolone cream (KENALOG) 0.1 %   Other Relevant Orders   Ambulatory referral to Dermatology     Other   Vitamin D deficiency - Primary    We will check serum level, further recommendations may be based on results.  Patient to continue taking supplement.      Relevant Orders   TSH   Hemoglobin A1c   CBC   VITAMIN D 25 Hydroxy (Vit-D Deficiency, Fractures)   Class 3 severe obesity with serious comorbidity and body mass index (BMI) of 40.0 to 44.9 in adult [facility])    Labs ordered for further evaluation, patient educated on lifestyle modification aimed at helping him with weight loss.  He was provided handout regarding  these.  Patient to follow-up in approximately 1 month.      Relevant Orders   TSH   Hemoglobin A1c   CBC   VITAMIN D 25 Hydroxy (Vit-D Deficiency, Fractures)   Infertility counseling    Referral to urology made today.  Further recommendations may be made based upon these results.      Relevant Orders   Ambulatory referral to Urology    Return in about 4 weeks (around 06/03/2022) for F/U with Bobby Barton.    Ailene Ards, NP

## 2022-05-31 ENCOUNTER — Encounter: Payer: Self-pay | Admitting: Nurse Practitioner

## 2022-06-10 ENCOUNTER — Ambulatory Visit: Payer: 59 | Admitting: Nurse Practitioner

## 2022-06-22 DIAGNOSIS — G4733 Obstructive sleep apnea (adult) (pediatric): Secondary | ICD-10-CM

## 2022-06-22 DIAGNOSIS — R0683 Snoring: Secondary | ICD-10-CM

## 2022-06-22 NOTE — Telephone Encounter (Signed)
Mychart messages sent by pt: Jackson Wallace [facility] (supporting Glenford Bayley, NP)49 minutes ago (10:40 AM)   JD It also makes me feel like I'm fainting when I take certain deep breathes. I went to the hospital to make sure everything was okay with my heart and they say it was. I'm guessing it's the effects of the pressure. I've stop using the machine since Monday. I still have a small pressure feeling in my chest and my heart.    Jackson Wallace  P [facility] (supporting Glenford Bayley, NP)54 minutes ago (10:36 AM)   JD Good morning, the current pressure level of 5 for my machine is too much for me. It's causing me to have chest pain and exhaustion. I talk to my cpap provider and was informed that I would need a different prescription in order for them to change the level of it. I was wondering if we could change the level to 2 or 3      Beth, please advise.

## 2022-06-23 ENCOUNTER — Ambulatory Visit: Payer: 59 | Admitting: Urology

## 2022-06-24 NOTE — Telephone Encounter (Signed)
Please order CPAP titration study, also can you get me a download to look at to adjust in the mean time

## 2022-07-06 ENCOUNTER — Ambulatory Visit: Payer: 59 | Admitting: Urology

## 2022-07-20 IMAGING — CR DG CHEST 2V
2 series · 2 of 2 positions shown · non-contrast
Comparison: None.

CLINICAL DATA: 29 y.o male arrived via walk in c/o chest pain, SOB
and dizziness that started while at work this afternoon.

EXAM:
CHEST - 2 VIEW

[w chest pa]
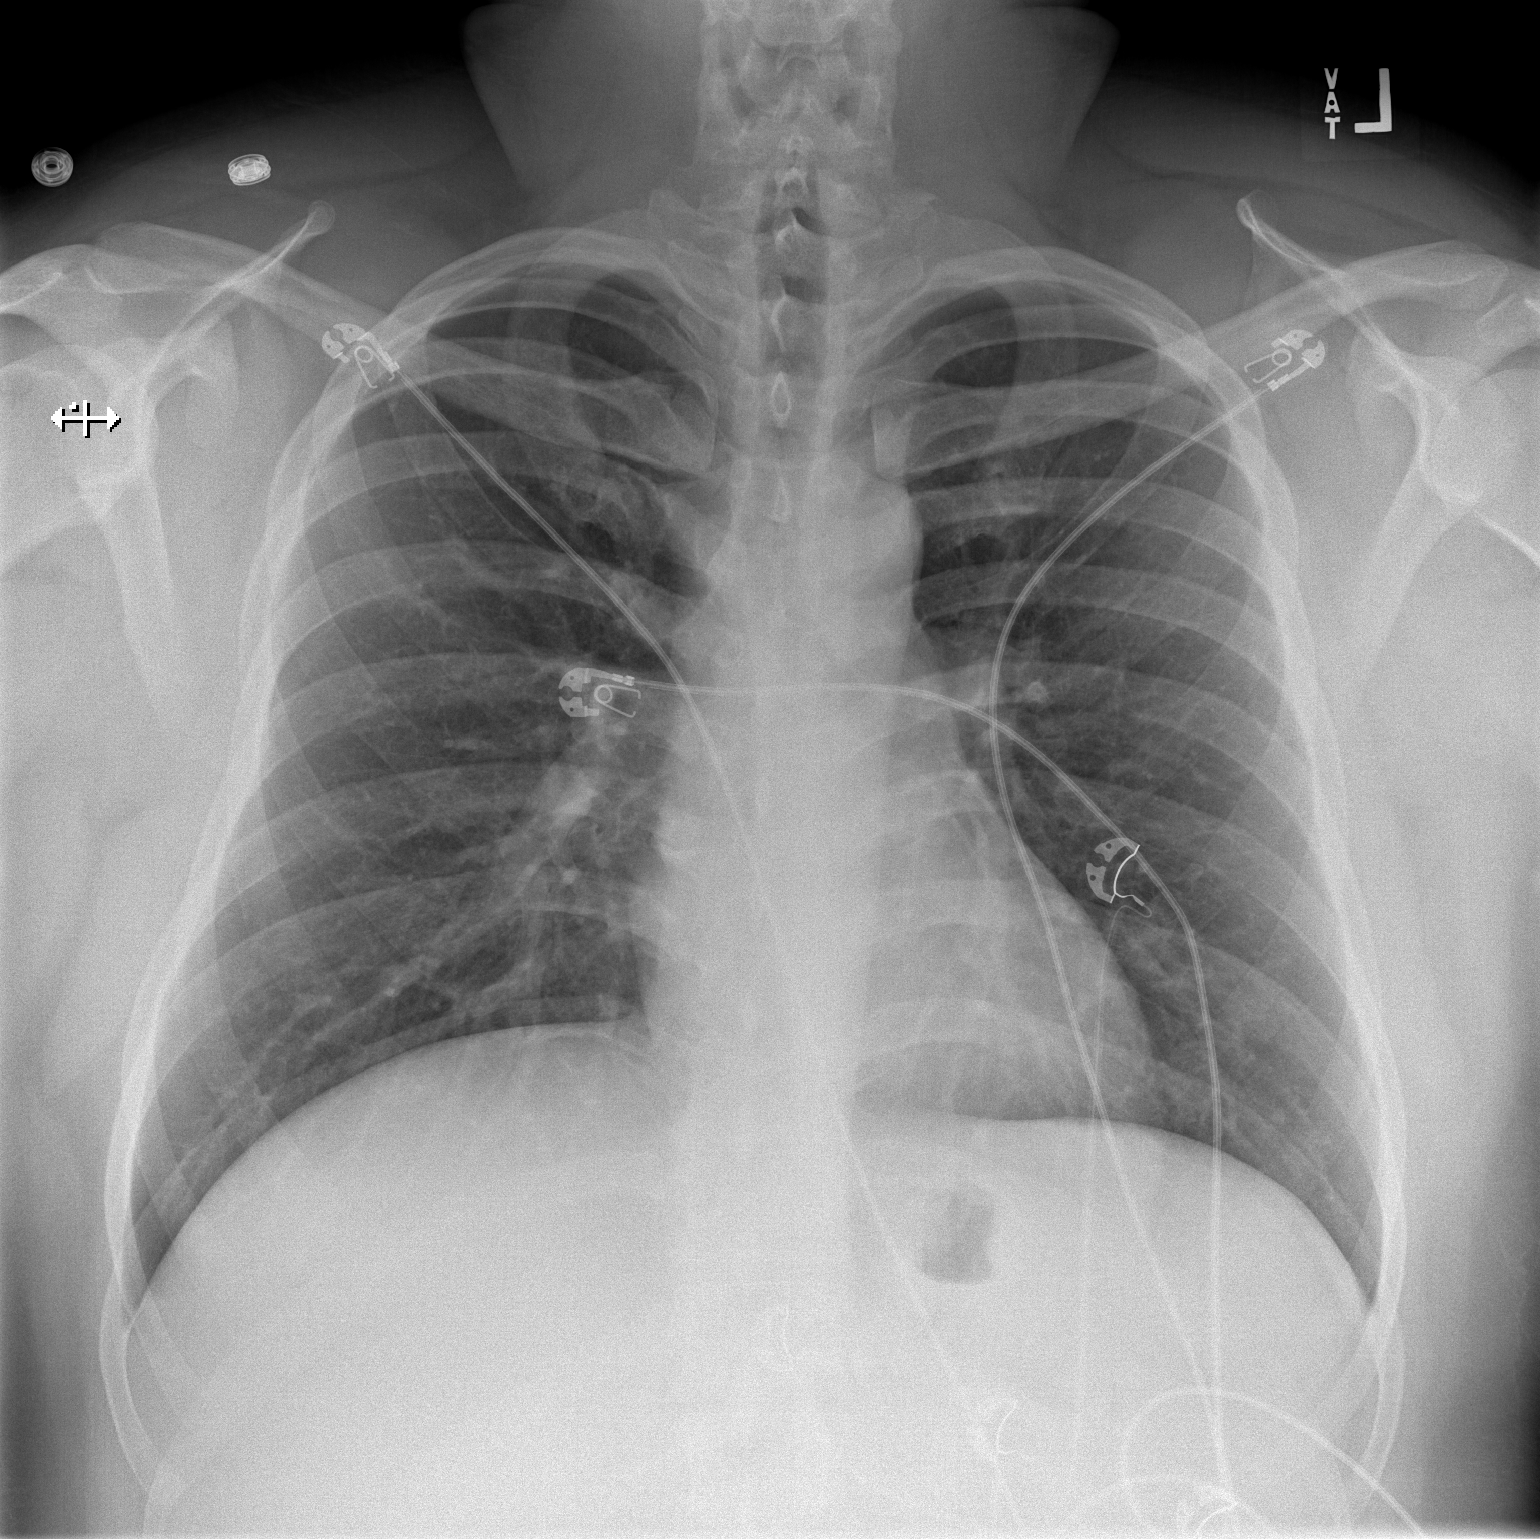

[w chest lat]
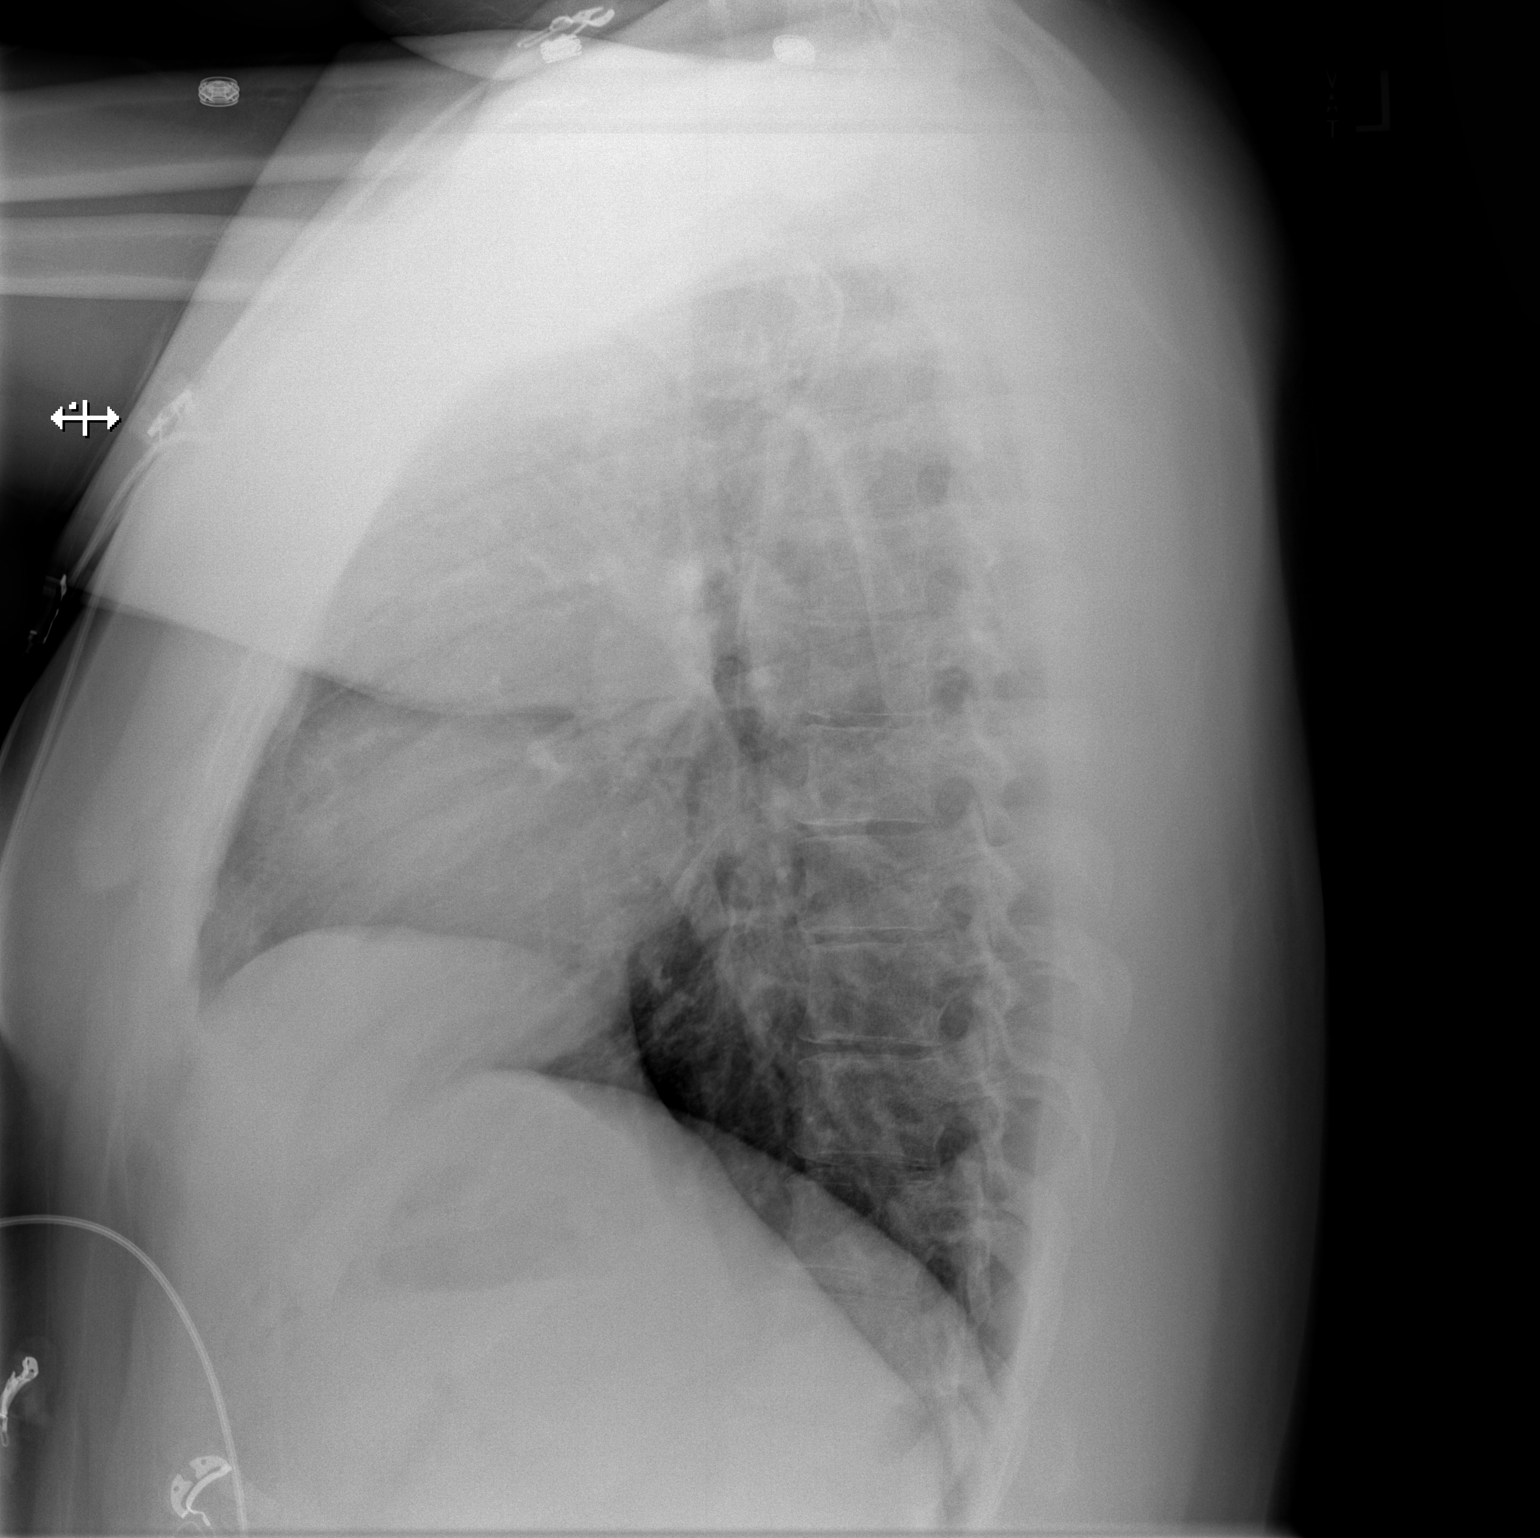

[2 of 2 positions shown; findings below may reference images not displayed]

FINDINGS: The heart size and mediastinal contours are within normal limits.
Both lungs are clear. No pleural effusion or pneumothorax. The
visualized skeletal structures are unremarkable.
IMPRESSION: Normal chest radiographs.

## 2022-08-04 ENCOUNTER — Ambulatory Visit (INDEPENDENT_AMBULATORY_CARE_PROVIDER_SITE_OTHER): Payer: 59 | Admitting: Urology

## 2022-08-04 ENCOUNTER — Encounter: Payer: Self-pay | Admitting: Urology

## 2022-08-04 VITALS — BP 149/93 | HR 91 | Ht 70.0 in | Wt 278.0 lb

## 2022-08-04 DIAGNOSIS — N469 Male infertility, unspecified: Secondary | ICD-10-CM | POA: Diagnosis not present

## 2022-08-04 NOTE — Progress Notes (Signed)
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 Recommend further evaluation with semen analysis. Semen analysis requested at [place] [facility]. Will contact him with results and recommendations.  Chief Complaint:  Chief Complaint  Patient presents with   Infertility    History of Present Illness:  Jackson Wallace is a 32 y.o. male who is seen in consultation from Ailene Ards, NP for evaluation of male infertility.  History:  Duration: 1 year Prior pregnancies: No  Previous treatments: No Evaluation/treatment of fiance: Yes - she has irregular cycles  Sexual history:  ED: No Lubricants: No Timing of intercourse: irregular Frequency of intercourse: random Frequency of masturbation: rarely  Childhood and Development:  GU anomalies: No Undescended testicle or orchidopexy: No Herniorrhaphy: No Testicular torsion: No Testicular trauma: No Y-V plasty of bladder: No Onset of puberty: normal  Medical History:  Systemic illness: none Previous chemotherapy or radiation: No  Surgical History:  Orchiectomy: No Retroperitoneal surgery: No Pelvic injury: No Pelvic, inguinal, scrotal surgery: No Herniorrhaphy: No TURP: No  Infections:  Recent high fevers: No Mumps orchitis: No History of venereal disease: No Tuberculosis: No  Toxin exposures:  Chemicals (pesticides): No Drugs: ( chemotherapy, cimetidine, sulfasalazine, Nitrofurantoin ) No Illicit drugs: (alcohol, marijuana, steroids) No Thermal exposure: No Radiation exposure: No Smoking: No  Family History:  Cystic fibrosis: No Androgen receptor deficiency: No Infertile first-degree relatives: No  Review of Systems:  Respiratory infections: No Anosmia: No Galactorrhea: No Impaired visual fields: No   Past Medical History:  Past Medical History:  Diagnosis Date   Allergy     Past Surgical History:   No past surgical history on file.  Allergies:  Allergies  Allergen Reactions   Shrimp [Shellfish Allergy]     Family History:  Family History  Problem Relation Age of Onset   Diabetes Maternal Grandmother     Social History:  Social History   Tobacco Use   Smoking status: Never   Smokeless tobacco: Never  Vaping Use   Vaping Use: Never used  Substance Use Topics   Alcohol use: No   Drug use: No    Review of symptoms:  Constitutional:  Negative for unexplained weight loss, night sweats, fever, chills ENT:  Negative for nose bleeds, sinus pain, painful swallowing CV:  Negative for chest pain, shortness of breath, exercise intolerance, palpitations, loss of consciousness Resp:  Negative for cough, wheezing, shortness of breath GI:  Negative for nausea, vomiting, diarrhea, bloody stools GU:  Positives noted in HPI; otherwise negative for gross hematuria, dysuria, urinary incontinence Neuro:  Negative for seizures, poor balance, limb weakness, slurred speech Psych:  Negative for lack of energy, depression, anxiety Endocrine:  Negative for polydipsia, polyuria, symptoms of hypoglycemia (dizziness, hunger, sweating) Hematologic:  Negative for anemia, purpura, petechia, prolonged or excessive bleeding, use of anticoagulants  Allergic:  Negative for difficulty breathing or choking as a result of exposure to anything; no shellfish allergy; no allergic response (rash/itch) to materials, foods  Physical exam: BP (!) 149/93   Pulse 91   Ht 5\' 10"  (1.778 m)   Wt 278 lb (126.1 kg)   BMI 39.89 kg/m  GENERAL APPEARANCE:  Well appearing, well developed, well nourished, NAD HEENT: Atraumatic, Normocephalic, oropharynx clear. NECK: Supple without lymphadenopathy or thyromegaly. LUNGS: Clear to auscultation bilaterally. HEART: Regular Rate and Rhythm without murmurs, gallops, or rubs. ABDOMEN: Soft, non-tender, No Masses. EXTREMITIES: Moves all extremities well.  Without clubbing,  cyanosis, or edema. NEUROLOGIC:  Alert and oriented x 3, normal gait, CN II-XII grossly intact.  MENTAL STATUS:  Appropriate. BACK:  Non-tender to palpation.  No CVAT SKIN:  Warm, dry and intact.   GU: Penis:  uncircumcised Meatus: Normal Scrotum: vas palpated bilaterally, no varicocele palpated Testis: normal without masses bilateral Epididymis: normal   Results: None

## 2022-08-05 ENCOUNTER — Ambulatory Visit (INDEPENDENT_AMBULATORY_CARE_PROVIDER_SITE_OTHER): Payer: 59 | Admitting: Nurse Practitioner

## 2022-08-05 VITALS — BP 140/88 | HR 67 | Temp 97.6°F | Ht 70.0 in | Wt 278.2 lb

## 2022-08-05 DIAGNOSIS — E559 Vitamin D deficiency, unspecified: Secondary | ICD-10-CM | POA: Diagnosis not present

## 2022-08-05 DIAGNOSIS — J309 Allergic rhinitis, unspecified: Secondary | ICD-10-CM | POA: Insufficient documentation

## 2022-08-05 DIAGNOSIS — E785 Hyperlipidemia, unspecified: Secondary | ICD-10-CM

## 2022-08-05 DIAGNOSIS — Z6839 Body mass index (BMI) 39.0-39.9, adult: Secondary | ICD-10-CM | POA: Diagnosis not present

## 2022-08-05 DIAGNOSIS — R21 Rash and other nonspecific skin eruption: Secondary | ICD-10-CM

## 2022-08-05 DIAGNOSIS — I10 Essential (primary) hypertension: Secondary | ICD-10-CM | POA: Diagnosis not present

## 2022-08-05 DIAGNOSIS — R0981 Nasal congestion: Secondary | ICD-10-CM | POA: Insufficient documentation

## 2022-08-05 LAB — VITAMIN D 25 HYDROXY (VIT D DEFICIENCY, FRACTURES): VITD: 16.83 ng/mL — ABNORMAL LOW (ref 30.00–100.00)

## 2022-08-05 LAB — BASIC METABOLIC PANEL
BUN: 18 mg/dL (ref 6–23)
CO2: 29 mEq/L (ref 19–32)
Calcium: 9.2 mg/dL (ref 8.4–10.5)
Chloride: 102 mEq/L (ref 96–112)
Creatinine, Ser: 0.65 mg/dL (ref 0.40–1.50)
GFR: 126.82 mL/min (ref >60.00)
Glucose, Bld: 89 mg/dL (ref 70–99)
Potassium: 3.9 mEq/L (ref 3.5–5.1)
Sodium: 139 mEq/L (ref 135–145)

## 2022-08-05 LAB — LIPID PANEL
Cholesterol: 157 mg/dL (ref 0–200)
HDL: 33.1 mg/dL — ABNORMAL LOW (ref >39.00)
LDL Cholesterol: 106 mg/dL — ABNORMAL HIGH (ref 0–99)
NonHDL: 123.87
Total CHOL/HDL Ratio: 5
Triglycerides: 91 mg/dL (ref 0.0–149.0)
VLDL: 18.2 mg/dL (ref 0.0–40.0)

## 2022-08-05 MED ORDER — FLUTICASONE PROPIONATE 50 MCG/ACT NA SUSP: 2.0000 | Freq: Every day | NASAL | 6 refills | Status: DC

## 2022-08-05 MED ORDER — LOSARTAN POTASSIUM-HCTZ 50-12.5 MG PO TABS: 1.0000 | ORAL_TABLET | Freq: Every day | ORAL | 3 refills | Status: DC

## 2022-08-05 NOTE — Assessment & Plan Note (Signed)
Congratulated on successful weight loss thus far and encouraged him to continue focusing on lifestyle modification aimed at further weight loss.  Patient reports understanding.

## 2022-08-05 NOTE — Assessment & Plan Note (Signed)
Encouraged to follow-up with dermatology as scheduled.

## 2022-08-05 NOTE — Assessment & Plan Note (Signed)
Patient continues on vitamin D supplement, check serum vitamin D level today.  Further recommendations may be made based upon his results.

## 2022-08-05 NOTE — Assessment & Plan Note (Signed)
Chronic, recommend daily use of Zyrtec and Flonase nasal spray to see if this results in improvement in symptoms.  If not may need to consider prescribing montelukast, we did discuss potential side effects and patient would prefer not to take montelukast for now but was encouraged to let me know if symptoms persist and he would like to try medication.  He reports understanding.

## 2022-08-05 NOTE — Assessment & Plan Note (Signed)
Chronic, above goal today however patient did not take antihypertensives yet this morning.  Will check metabolic panel and we will plan on patient continuing on Hyzaar 50-12.5 mg tablet once a day.  Follow-up in 3 months for monitoring.

## 2022-08-05 NOTE — Progress Notes (Addendum)
Established Patient Office Visit  Subjective   Patient ID: Jackson Wallace, male    DOB: 1990/07/22  Age: 32 y.o. MRN: [id number]  Chief Complaint  Patient presents with   Obesity    Sleep apnea/obesity: Was started on AutoPap but reports that the pressure was too much and actually overextended his lungs which resulted in chest pain.  He is no longer using CPAP but this has motivated him to focus on weight loss.  He has been making lifestyle changes and has been able to lose approximately 30 pounds thus far.  Rash: Had been referred to dermatology and they diagnosed him with eczema which they recommended daily use of ointment.  They also are treating his rash on his back with clindamycin 1% topical lotion daily after showering.  Hypertension/hyperlipidemia: He continues on losartan-hydrochlorothiazide.  Tolerating medication well, reports that he has not taken his medication yet today.  Last lipid panel collected about 6 months ago showing LDL of 139, patient would like to repeat lipid panel today since he is lost about 30 pounds.  Allergic rhinitis: Currently taking Zyrtec and Flonase as needed.  Reports more nasal congestion and would like to discuss treatment of this.    Review of Systems  Constitutional:  Positive for weight loss (intentional).  Respiratory:  Negative for shortness of breath.   Cardiovascular:  Negative for chest pain.      Objective:     BP (!) 140/88   Pulse 67   Temp 97.6 F (36.4 C) (Temporal)   Ht 5\' 10"  (1.778 m)   Wt 278 lb 4 oz (126.2 kg)   SpO2 97%   BMI 39.92 kg/m  BP Readings from Last 3 Encounters:  08/05/22 (!) 140/88  08/04/22 (!) 149/93  05/06/22 (!) 140/86   Wt Readings from Last 3 Encounters:  08/05/22 278 lb 4 oz (126.2 kg)  08/04/22 278 lb (126.1 kg)  05/06/22 (!) 300 lb 4 oz (136.2 kg)      Physical Exam Vitals reviewed.  Constitutional:      Appearance: Normal appearance.  HENT:     Head: Normocephalic  and atraumatic.  Cardiovascular:     Rate and Rhythm: Normal rate and regular rhythm.  Pulmonary:     Effort: Pulmonary effort is normal.     Breath sounds: Normal breath sounds.  Musculoskeletal:     Cervical back: Neck supple.  Skin:    General: Skin is warm and dry.  Neurological:     Mental Status: He is alert and oriented to person, place, and time.  Psychiatric:        Mood and Affect: Mood normal.        Behavior: Behavior normal.        Thought Content: Thought content normal.        Judgment: Judgment normal.      No results found for any visits on 08/05/22.    The ASCVD Risk score (Arnett DK, et al., 2019) failed to calculate for the following reasons:   The 2019 ASCVD risk score is only valid for ages 56 to 63    Assessment & Plan:   Problem List Items Addressed This Visit       Cardiovascular and Mediastinum   Essential hypertension    Chronic, above goal today however patient did not take antihypertensives yet this morning.  Will check metabolic panel and we will plan on patient continuing on Hyzaar 50-12.5 mg tablet once a day.  Follow-up in  3 months for monitoring.      Relevant Medications   losartan-hydrochlorothiazide (HYZAAR) 50-12.5 MG tablet   Other Relevant Orders   VITAMIN D 25 Hydroxy (Vit-D Deficiency, Fractures)   Basic metabolic panel   Lipid panel     Respiratory   Allergic rhinitis    Chronic, recommend daily use of Zyrtec and Flonase nasal spray to see if this results in improvement in symptoms.  If not may need to consider prescribing montelukast, we did discuss potential side effects and patient would prefer not to take montelukast for now but was encouraged to let me know if symptoms persist and he would like to try medication.  He reports understanding.      Relevant Medications   fluticasone (FLONASE) 50 MCG/ACT nasal spray     Musculoskeletal and Integument   Rash    Encouraged to follow-up with dermatology as scheduled.       Relevant Medications   clindamycin (CLEOCIN T) 1 % lotion     Other   Vitamin D deficiency    Patient continues on vitamin D supplement, check serum vitamin D level today.  Further recommendations may be made based upon his results.      Relevant Orders   VITAMIN D 25 Hydroxy (Vit-D Deficiency, Fractures)   Class 2 severe obesity with serious comorbidity and body mass index (BMI) of 39.0 to 39.9 in adult [facility]) - Primary    Congratulated on successful weight loss thus far and encouraged him to continue focusing on lifestyle modification aimed at further weight loss.  Patient reports understanding.      Relevant Medications   losartan-hydrochlorothiazide (HYZAAR) 50-12.5 MG tablet   Other Relevant Orders   VITAMIN D 25 Hydroxy (Vit-D Deficiency, Fractures)   Basic metabolic panel   Lipid panel   Hyperlipidemia    Chronic, order fasting lipid panel today.  Further recommendations may be made based upon these results.      Relevant Medications   losartan-hydrochlorothiazide (HYZAAR) 50-12.5 MG tablet   Other Relevant Orders   Basic metabolic panel   Lipid panel    Return in about 3 months (around 11/04/2022) for F/u With Icel Castles.    Ailene Ards, NP

## 2022-08-05 NOTE — Assessment & Plan Note (Signed)
Chronic, order fasting lipid panel today.  Further recommendations may be made based upon these results.

## 2022-09-04 ENCOUNTER — Telehealth: Payer: 59 | Admitting: Physician Assistant

## 2022-09-04 DIAGNOSIS — M5442 Lumbago with sciatica, left side: Secondary | ICD-10-CM

## 2022-09-04 MED ORDER — CYCLOBENZAPRINE HCL 10 MG PO TABS: 10.0000 mg | ORAL_TABLET | Freq: Three times a day (TID) | ORAL | 0 refills | Status: DC | PRN

## 2022-09-04 MED ORDER — NAPROXEN 500 MG PO TABS: 500.0000 mg | ORAL_TABLET | Freq: Two times a day (BID) | ORAL | 0 refills | Status: DC

## 2022-09-04 NOTE — Progress Notes (Signed)
We are sorry that you are not feeling well.  Here is how we plan to help!  Based on what you have shared with me it looks like you mostly have acute back pain.  Acute back pain is defined as musculoskeletal pain that can resolve in 1-3 weeks with conservative treatment.  I have prescribed Naprosyn 500 mg take one by mouth twice a day non-steroid anti-inflammatory (NSAID) as well as Flexeril 10 mg every eight hours as needed which is a muscle relaxer  Some patients experience stomach irritation or in increased heartburn with anti-inflammatory drugs.  Please keep in mind that muscle relaxer's can cause fatigue and should not be taken while at work or driving.  Back pain is very common.  The pain often gets better over time.  The cause of back pain is usually not dangerous.  Most people can learn to manage their back pain on their own.  Please follow up with your Primary Care Physician or go to Urgent Care for further evaluation if no improvement.   Home Care Stay active.  Start with short walks on flat ground if you can.  Try to walk farther each day. Do not sit, drive or stand in one place for more than 30 minutes.  Do not stay in bed. Do not avoid exercise or work.  Activity can help your back heal faster. Be careful when you bend or lift an object.  Bend at your knees, keep the object close to you, and do not twist. Sleep on a firm mattress.  Lie on your side, and bend your knees.  If you lie on your back, put a pillow under your knees. Only take medicines as told by your doctor. Put ice on the injured area. Put ice in a plastic bag Place a towel between your skin and the bag Leave the ice on for 15-20 minutes, 3-4 times a day for the first 2-3 days. 210 After that, you can switch between ice and heat packs. Ask your doctor about back exercises or massage. Avoid feeling anxious or stressed.  Find good ways to deal with stress, such as exercise.  Get Help Right Way If: Your pain does not go  away with rest or medicine. Your pain does not go away in 1 week. You have new problems. You do not feel well. The pain spreads into your legs. You cannot control when you poop (bowel movement) or pee (urinate) You feel sick to your stomach (nauseous) or throw up (vomit) You have belly (abdominal) pain. You feel like you may pass out (faint). If you develop a fever.  Make Sure you: Understand these instructions. Will watch your condition Will get help right away if you are not doing well or get worse.  Your e-visit answers were reviewed by a board certified advanced clinical practitioner to complete your personal care plan.  Depending on the condition, your plan could have included both over the counter or prescription medications.  If there is a problem please reply  once you have received a response from your provider.  Your safety is important to [place].  If you have drug allergies check your prescription carefully.    You can use MyChart to ask questions about today's visit, request a non-urgent call back, or ask for a work or school excuse for 24 hours related to this e-Visit. If it has been greater than 24 hours you will need to follow up with your provider, or enter a new e-Visit to address  those concerns.  You will get an e-mail in the next two days asking about your experience.  I hope that your e-visit has been valuable and will speed your recovery. Thank you for using e-visits.   I have spent 5 minutes in review of e-visit questionnaire, review and updating patient chart, medical decision making and response to patient.   Lenise Arena Ward, PA-C

## 2022-11-03 ENCOUNTER — Ambulatory Visit: Payer: 59 | Admitting: Nurse Practitioner

## 2022-11-06 ENCOUNTER — Emergency Department (HOSPITAL_COMMUNITY): Payer: 59

## 2022-11-06 ENCOUNTER — Encounter (HOSPITAL_COMMUNITY): Payer: Self-pay | Admitting: Emergency Medicine

## 2022-11-06 ENCOUNTER — Emergency Department (HOSPITAL_COMMUNITY)
Admission: EM | Admit: 2022-11-06 | Discharge: 2022-11-06 | Disposition: A | Discharge status: Home / Self Care | Payer: 59 | Attending: Emergency Medicine | Admitting: Emergency Medicine

## 2022-11-06 DIAGNOSIS — R079 Chest pain, unspecified: Secondary | ICD-10-CM | POA: Insufficient documentation

## 2022-11-06 DIAGNOSIS — I10 Essential (primary) hypertension: Secondary | ICD-10-CM | POA: Diagnosis not present

## 2022-11-06 HISTORY — DX: Essential (primary) hypertension: I10

## 2022-11-06 LAB — CBC WITH DIFFERENTIAL/PLATELET
Abs Immature Granulocytes: 0.05 10*3/uL (ref 0.00–0.07)
Basophils Absolute: 0 10*3/uL (ref 0.0–0.1)
Basophils Relative: 0 %
Eosinophils Absolute: 0.2 10*3/uL (ref 0.0–0.5)
Eosinophils Relative: 3 %
HCT: 40.3 % (ref 39.0–52.0)
Hemoglobin: 13.5 g/dL (ref 13.0–17.0)
Immature Granulocytes: 1 %
Lymphocytes Relative: 31 %
Lymphs Abs: 2.8 10*3/uL (ref 0.7–4.0)
MCH: 27.2 pg (ref 26.0–34.0)
MCHC: 33.5 g/dL (ref 30.0–36.0)
MCV: 81.3 fL (ref 80.0–100.0)
Monocytes Absolute: 0.7 10*3/uL (ref 0.1–1.0)
Monocytes Relative: 7 %
Neutro Abs: 5.4 10*3/uL (ref 1.7–7.7)
Neutrophils Relative %: 58 %
Platelets: 365 10*3/uL (ref 150–400)
RBC: 4.96 MIL/uL (ref 4.22–5.81)
RDW: 14.8 % (ref 11.5–15.5)
WBC: 9.2 10*3/uL (ref 4.0–10.5)
nRBC: 0 % (ref 0.0–0.2)

## 2022-11-06 LAB — COMPREHENSIVE METABOLIC PANEL
ALT: 25 U/L (ref 0–44)
AST: 19 U/L (ref 15–41)
Albumin: 3.8 g/dL (ref 3.5–5.0)
Alkaline Phosphatase: 95 U/L (ref 38–126)
Anion gap: 11 (ref 5–15)
BUN: 14 mg/dL (ref 6–20)
CO2: 27 mmol/L (ref 22–32)
Calcium: 9.3 mg/dL (ref 8.9–10.3)
Chloride: 99 mmol/L (ref 98–111)
Creatinine, Ser: 0.84 mg/dL (ref 0.61–1.24)
GFR, Estimated: >60 mL/min (ref >60)
Glucose, Bld: 104 mg/dL — ABNORMAL HIGH (ref 70–99)
Potassium: 3.8 mmol/L (ref 3.5–5.1)
Sodium: 137 mmol/L (ref 135–145)
Total Bilirubin: 0.5 mg/dL (ref 0.3–1.2)
Total Protein: 7.5 g/dL (ref 6.5–8.1)

## 2022-11-06 LAB — LIPASE, BLOOD: Lipase: 24 U/L (ref 11–51)

## 2022-11-06 LAB — TROPONIN I (HIGH SENSITIVITY): Troponin I (High Sensitivity): 4 ng/L

## 2022-11-06 MED ORDER — NAPROXEN 500 MG PO TABS: 500.0000 mg | ORAL_TABLET | Freq: Two times a day (BID) | ORAL | 0 refills | Status: DC

## 2022-11-06 MED ORDER — IOHEXOL 350 MG/ML SOLN
75.0000 mL | Freq: Once | INTRAVENOUS | Status: AC | PRN
  Administered 2022-11-06: 75 mL via INTRAVENOUS

## 2022-11-06 NOTE — ED Provider Notes (Signed)
[facility]ENC[facility] DEPT [facility] Emergency Department Provider Note MRN:  [id number]  Arrival date & time: 11/06/22     Chief Complaint   Chest Pain   History of Present Illness   Jackson Wallace is a 32 y.o. year-old male with a history of hypertension presenting to the ED with chief complaint of chest pain.  Chest pain described as a sharp pain with radiation to the mid thoracic back.  Also with decreased sensation to the left arm.  Review of Systems  A thorough review of systems was obtained and all systems are negative except as noted in the HPI and PMH.   Patient's Health History    Past Medical History:  Diagnosis Date   Allergy    Hypertension     No past surgical history on file.  Family History  Problem Relation Age of Onset   Diabetes Maternal Grandmother     Social History   Socioeconomic History   Marital status: Significant Other    Spouse name: Not on file   Number of children: 0   Years of education: Not on file   Highest education level: Not on file  Occupational History   Occupation: driver  Tobacco Use   Smoking status: Never   Smokeless tobacco: Never  Vaping Use   Vaping Use: Never used  Substance and Sexual Activity   Alcohol use: No   Drug use: No   Sexual activity: Not Currently  Other Topics Concern   Not on file  Social History Narrative   Not on file   Social Determinants of Health   Financial Resource Strain: Low Risk  (04/26/2019)   Overall Financial Resource Strain (CARDIA)    Difficulty of Paying Living Expenses: Not hard at all  Food Insecurity: No Food Insecurity (04/26/2019)   Hunger Vital Sign    Worried About Running Out of Food in the Last Year: Never true    Ran Out of Food in the Last Year: Never true  Transportation Needs: No Transportation Needs (04/26/2019)   PRAPARE - Administrator, Civil Service (Medical): No    Lack of Transportation (Non-Medical): No  Physical Activity:  Insufficiently Active (04/26/2019)   Exercise Vital Sign    Days of Exercise per Week: 3 days    Minutes of Exercise per Session: 30 min  Stress: No Stress Concern Present (04/26/2019)   [employer] of Occupational Health - Occupational Stress Questionnaire    Feeling of Stress : Not at all  Social Connections: Unknown (04/26/2019)   Social Connection and Isolation Panel [NHANES]    Frequency of Communication with Friends and Family: Three times a week    Frequency of Social Gatherings with Friends and Family: Twice a week    Attends Religious Services: Patient declined    Database administrator or Organizations: Patient declined    Attends Banker Meetings: Patient declined    Marital Status: Living with partner  Intimate Partner Violence: Not At Risk (04/26/2019)   Humiliation, Afraid, Rape, and Kick questionnaire    Fear of Current or Ex-Partner: No    Emotionally Abused: No    Physically Abused: No    Sexually Abused: No     Physical Exam   Vitals:   11/06/22 0340  BP: (!) 141/106  Pulse: 87  Resp: 18  Temp: 98.2 F (36.8 C)  SpO2: 100%    CONSTITUTIONAL: Well-appearing, NAD NEURO/PSYCH:  Alert and oriented x 3, subjective mild decrease sensation  of the left arm EYES:  eyes equal and reactive ENT/NECK:  no LAD, no JVD CARDIO: Regular rate, well-perfused, normal S1 and S2 PULM:  CTAB no wheezing or rhonchi GI/GU:  non-distended, non-tender MSK/SPINE:  No gross deformities, no edema SKIN:  no rash, atraumatic   *Additional and/or pertinent findings included in MDM below  Diagnostic and Interventional Summary    EKG Interpretation  Date/Time:  Sunday November 06 2022 03:25:50 EDT Ventricular Rate:  84 PR Interval:  152 QRS Duration: 84 QT Interval:  382 QTC Calculation: 451 R Axis:   54 Text Interpretation: Normal sinus rhythm Cannot rule out Anterior infarct , age undetermined Abnormal ECG When compared with ECG of 23-Oct-2020 12:09,  PREVIOUS ECG IS PRESENT Confirmed by Wilmont Olund ([id number]) on 11/06/2022 4:21:32 AM       Labs Reviewed  COMPREHENSIVE METABOLIC PANEL - Abnormal; Notable for the following components:      Result Value   Glucose, Bld 104 (*)    All other components within normal limits  LIPASE, BLOOD  CBC WITH DIFFERENTIAL/PLATELET  TROPONIN I (HIGH SENSITIVITY)  TROPONIN I (HIGH SENSITIVITY)    CT Angio Chest/Abd/Pel for Dissection W and/or Wo Contrast  Final Result    CT HEAD WO CONTRAST (5MM)  Final Result    DG Chest 2 View  Final Result      Medications  iohexol (OMNIPAQUE) 350 MG/ML injection 75 mL (75 mLs Intravenous Contrast Given 11/06/22 0516)     Procedures  /  Critical Care Procedures  ED Course and Medical Decision Making  Initial Impression and Ddx Aortic dissection is considered given the history.  Odd given that patient is resting so comfortably with normal vital signs.  Suspect other benign causes as well such as MSK, GERD, anxiety.  Past medical/surgical history that increases complexity of ED encounter: None  Interpretation of Diagnostics I personally reviewed the EKG and my interpretation is as follows: Sinus rhythm  Labs reassuring with no significant blood count or electrolyte disturbance, troponin negative x 2.  CT head and CT dissection study are unremarkable.  Patient Reassessment and Ultimate Disposition/Management     On reassessment patient is feeling better, he now has a normal neurological exam with no sensory issues to the left arm.  Suspect it was more of a paresthesia.  Appropriate for discharge.  Patient management required discussion with the following services or consulting groups:  None  Complexity of Problems Addressed Acute illness or injury that poses threat of life of bodily function  Additional Data Reviewed and Analyzed Further history obtained from: Further history from spouse/family member  Additional Factors Impacting ED Encounter  Risk Prescriptions  Ernie Sagrero M. Charlottie Peragine, MD [facility] [email or web address]  Final Clinical Impressions(s) / ED Diagnoses     ICD-10-CM   1. Chest pain, unspecified type  R07.9       ED Discharge Orders          Ordered    naproxen (NAPROSYN) 500 MG tablet  2 times daily        04 /28/24 0628             Discharge Instructions Discussed with and Provided to Patient:    Discharge Instructions      You were evaluated in the Emergency Department and after careful evaluation, we did not find any emergent condition requiring admission or further testing in the hospital.  Your exam/testing today is overall reassuring.  Symptoms seem to be due to  muscular related pain.  Use the Naprosyn anti-inflammatory as directed.  Recommend follow-up with primary care doctor.  Please return to the Emergency Department if you experience any worsening of your condition.   Thank you for allowing [place] to be a part of your care.      Sabas Sous, MD 11/06/22 [phone]

## 2022-11-06 NOTE — ED Triage Notes (Signed)
Pt reports chest pian for a few days. Centralized and associated with nausea, numbness to left arm, radiating around to back. Denies SOB.

## 2022-11-06 NOTE — Discharge Instructions (Signed)
You were evaluated in the Emergency Department and after careful evaluation, we did not find any emergent condition requiring admission or further testing in the hospital.  Your exam/testing today is overall reassuring.  Symptoms seem to be due to muscular related pain.  Use the Naprosyn anti-inflammatory as directed.  Recommend follow-up with primary care doctor.  Please return to the Emergency Department if you experience any worsening of your condition.   Thank you for allowing [place] to be a part of your care.

## 2022-11-07 ENCOUNTER — Telehealth: Payer: Self-pay

## 2022-11-07 NOTE — Transitions of Care (Post Inpatient/ED Visit) (Signed)
   11/07/2022  Name: Jackson Wallace MRN: [id number] DOB: [birth year]  Today's TOC FU Call Status: Today's TOC FU Call Status:: Successful TOC FU Call Competed TOC FU Call Complete Date: 11/07/22  Transition Care Management Follow-up Telephone Call Date of Discharge: 11/06/22 Discharge [facility]: [facility]) Type of Discharge: Inpatient Admission Primary Inpatient Discharge Diagnosis:: chest pain How have you been since you were released from the hospital?: Better Any questions or concerns?: No  Items Reviewed: Did you receive and understand the discharge instructions provided?: Yes Medications obtained and verified?: Yes (Medications Reviewed) Any new allergies since your discharge?: No Dietary orders reviewed?: Yes Do you have support at home?: Yes People in Home: parent(s)  Home Care and Equipment/Supplies: Were Home Health Services Ordered?: NA Any new equipment or medical supplies ordered?: NA  Functional Questionnaire: Do you need assistance with bathing/showering or dressing?: No Do you need assistance with meal preparation?: No Do you need assistance with eating?: No Do you have difficulty maintaining continence: No Do you need assistance with getting out of bed/getting out of a chair/moving?: No Do you have difficulty managing or taking your medications?: No  Follow up appointments reviewed: PCP Follow-up appointment confirmed?: NA Specialist Hospital Follow-up appointment confirmed?: NA Do you need transportation to your follow-up appointment?: No Do you understand care options if your condition(s) worsen?: Yes-patient verbalized understanding    SIGNATURE Karena Addison, LPN [facility] Nurse Health Advisor Direct Dial [phone]

## 2022-11-21 ENCOUNTER — Other Ambulatory Visit: Payer: Self-pay

## 2022-11-21 ENCOUNTER — Emergency Department (HOSPITAL_COMMUNITY): Payer: 59

## 2022-11-21 ENCOUNTER — Emergency Department (HOSPITAL_COMMUNITY)
Admission: EM | Admit: 2022-11-21 | Discharge: 2022-11-21 | Disposition: A | Discharge status: Home / Self Care | Payer: 59 | Attending: Emergency Medicine | Admitting: Emergency Medicine

## 2022-11-21 ENCOUNTER — Encounter (HOSPITAL_COMMUNITY): Payer: Self-pay

## 2022-11-21 DIAGNOSIS — G44319 Acute post-traumatic headache, not intractable: Secondary | ICD-10-CM

## 2022-11-21 DIAGNOSIS — Y92410 Unspecified street and highway as the place of occurrence of the external cause: Secondary | ICD-10-CM | POA: Insufficient documentation

## 2022-11-21 DIAGNOSIS — R519 Headache, unspecified: Secondary | ICD-10-CM | POA: Diagnosis present

## 2022-11-21 DIAGNOSIS — I10 Essential (primary) hypertension: Secondary | ICD-10-CM | POA: Insufficient documentation
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
## 2022-11-21 NOTE — Discharge Instructions (Signed)
You were evaluated today for a headache. Your CT exam was reassuring for no acute intracranial findings. Your symptoms do suggest a mild concussion. I have attached education on headaches and concussions. Please continue to use over the counter medications for your headache and follow up with primary care as needed. If you develop any life threatening symptoms please return to the emergency department.
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
## 2023-01-09 ENCOUNTER — Encounter: Payer: 59 | Admitting: Registered Nurse

## 2023-11-03 ENCOUNTER — Other Ambulatory Visit: Payer: Self-pay | Admitting: Nurse Practitioner

## 2023-11-03 DIAGNOSIS — I10 Essential (primary) hypertension: Secondary | ICD-10-CM

## 2023-12-01 ENCOUNTER — Ambulatory Visit (INDEPENDENT_AMBULATORY_CARE_PROVIDER_SITE_OTHER): Admitting: Nurse Practitioner

## 2023-12-01 ENCOUNTER — Ambulatory Visit: Payer: Self-pay | Admitting: Nurse Practitioner

## 2023-12-01 VITALS — BP 148/100 | HR 90 | Temp 98.1°F | Ht 70.0 in | Wt 295.5 lb

## 2023-12-01 DIAGNOSIS — J310 Chronic rhinitis: Secondary | ICD-10-CM | POA: Insufficient documentation

## 2023-12-01 DIAGNOSIS — Z6841 Body Mass Index (BMI) 40.0 and over, adult: Secondary | ICD-10-CM | POA: Diagnosis not present

## 2023-12-01 DIAGNOSIS — Z0001 Encounter for general adult medical examination with abnormal findings: Secondary | ICD-10-CM | POA: Insufficient documentation

## 2023-12-01 DIAGNOSIS — I10 Essential (primary) hypertension: Secondary | ICD-10-CM

## 2023-12-01 DIAGNOSIS — F411 Generalized anxiety disorder: Secondary | ICD-10-CM | POA: Insufficient documentation

## 2023-12-01 DIAGNOSIS — R5383 Other fatigue: Secondary | ICD-10-CM | POA: Insufficient documentation

## 2023-12-01 DIAGNOSIS — E669 Obesity, unspecified: Secondary | ICD-10-CM | POA: Insufficient documentation

## 2023-12-01 DIAGNOSIS — Z113 Encounter for screening for infections with a predominantly sexual mode of transmission: Secondary | ICD-10-CM

## 2023-12-01 DIAGNOSIS — E66813 Obesity, class 3: Secondary | ICD-10-CM | POA: Diagnosis not present

## 2023-12-01 DIAGNOSIS — Z0000 Encounter for general adult medical examination without abnormal findings: Secondary | ICD-10-CM

## 2023-12-01 LAB — LIPID PANEL
Cholesterol: 197 mg/dL (ref 0–200)
HDL: 39.7 mg/dL (ref >39.00)
LDL Cholesterol: 124 mg/dL — ABNORMAL HIGH (ref 0–99)
NonHDL: 157.68
Total CHOL/HDL Ratio: 5
Triglycerides: 166 mg/dL — ABNORMAL HIGH (ref 0.0–149.0)
VLDL: 33.2 mg/dL (ref 0.0–40.0)

## 2023-12-01 LAB — COMPREHENSIVE METABOLIC PANEL WITH GFR
ALT: 29 U/L (ref 0–53)
AST: 22 U/L (ref 0–37)
Albumin: 4.6 g/dL (ref 3.5–5.2)
Alkaline Phosphatase: 91 U/L (ref 39–117)
BUN: 16 mg/dL (ref 6–23)
CO2: 29 meq/L (ref 19–32)
Calcium: 9.3 mg/dL (ref 8.4–10.5)
Chloride: 101 meq/L (ref 96–112)
Creatinine, Ser: 0.74 mg/dL (ref 0.40–1.50)
GFR: 120.82 mL/min (ref >60.00)
Glucose, Bld: 93 mg/dL (ref 70–99)
Potassium: 4 meq/L (ref 3.5–5.1)
Sodium: 137 meq/L (ref 135–145)
Total Bilirubin: 0.4 mg/dL (ref 0.2–1.2)
Total Protein: 8 g/dL (ref 6.0–8.3)

## 2023-12-01 LAB — CBC
HCT: 40.6 % (ref 39.0–52.0)
Hemoglobin: 13.5 g/dL (ref 13.0–17.0)
MCHC: 33.2 g/dL (ref 30.0–36.0)
MCV: 80.8 fl (ref 78.0–100.0)
Platelets: 372 10*3/uL (ref 150.0–400.0)
RBC: 5.03 Mil/uL (ref 4.22–5.81)
RDW: 13.8 % (ref 11.5–15.5)
WBC: 7.3 10*3/uL (ref 4.0–10.5)

## 2023-12-01 LAB — TSH: TSH: 1.96 u[IU]/mL (ref 0.35–5.50)

## 2023-12-01 LAB — HEMOGLOBIN A1C: Hgb A1c MFr Bld: 6 % (ref 4.6–6.5)

## 2023-12-01 MED ORDER — LOSARTAN POTASSIUM-HCTZ 50-12.5 MG PO TABS
1.0000 | ORAL_TABLET | Freq: Every day | ORAL | 3 refills | Status: AC
Start: 2023-12-01 — End: ?

## 2023-12-01 MED ORDER — FLUOXETINE HCL 20 MG PO CAPS: 20.0000 mg | ORAL_CAPSULE | Freq: Every day | ORAL | 1 refills | Status: DC

## 2023-12-01 NOTE — Progress Notes (Signed)
 Complete physical exam  Patient: Jackson Wallace   DOB: 12-21-1990   32 y.o. Male  MRN: [id number]  Subjective:     Chief Complaint  Patient presents with   Annual Exam    Jackson Wallace is a 33 y.o. male who presents today for a complete physical exam.  He also has acute concerns today as detailed below: Anxiety: Feels that his mood has worsened and he is experiencing some anxiety.  He does have family history with his mom who has been treated with Prozac with improvement in mood.  He would like to discuss possibly taking medication to treat mood as well.  He reports he is currently in counseling and this has been helping.  No suicidal ideation. Hypertension: Chronic, inconsistently taking his losartan -hydrochlorothiazide.  Reports that he has not taken it today.  He also has comorbid CPAP and uses CPAP about 3 times a week. Chronic rhinitis: Chronic rhinitis has tried and failed antihistamine, nasal spray, and montelukast.  Wants to know next steps and treatment options.   Most recent fall risk assessment:    08/05/2022    8:28 AM  Fall Risk   Falls in the past year? 0  Number falls in past yr: 0  Injury with Fall? 0  Risk for fall due to : No Fall Risks  Follow up Falls evaluation completed     Most recent depression screenings:    08/05/2022    8:28 AM 05/06/2022   10:13 AM  PHQ 2/9 Scores  PHQ - 2 Score 0 0  PHQ- 9 Score 0 0      12/01/2023    1:16 PM  GAD 7 : Generalized Anxiety Score  Nervous, Anxious, on Edge 1  Control/stop worrying 0  Worry too much - different things 3  Trouble relaxing 3  Restless 2  Easily annoyed or irritable 0  Afraid - awful might happen 0  Total GAD 7 Score 9        Past Medical History:  Diagnosis Date   Allergy    Hypertension    No past surgical history on file. Social History   Socioeconomic History   Marital status: Significant Other    Spouse name: Not on file   Number of children: 0    Years of education: Not on file   Highest education level: Some college, no degree  Occupational History   Occupation: driver  Tobacco Use   Smoking status: Never   Smokeless tobacco: Never  Vaping Use   Vaping status: Never Used  Substance and Sexual Activity   Alcohol use: No   Drug use: No   Sexual activity: Not Currently  Other Topics Concern   Not on file  Social History Narrative   Not on file   Social Drivers of Health   Financial Resource Strain: Low Risk  (11/30/2023)   Overall Financial Resource Strain (CARDIA)    Difficulty of Paying Living Expenses: Not hard at all  Food Insecurity: No Food Insecurity (11/30/2023)   Hunger Vital Sign    Worried About Running Out of Food in the Last Year: Never true    Ran Out of Food in the Last Year: Never true  Transportation Needs: No Transportation Needs (11/30/2023)   PRAPARE - Administrator, Civil Service (Medical): No    Lack of Transportation (Non-Medical): No  Physical Activity: Sufficiently Active (11/30/2023)   Exercise Vital Sign    Days of Exercise per Week:  5 days    Minutes of Exercise per Session: 90 min  Stress: Stress Concern Present (11/30/2023)   [employer] of Occupational Health - Occupational Stress Questionnaire    Feeling of Stress : Rather much  Social Connections: Socially Isolated (11/30/2023)   Social Connection and Isolation Panel [NHANES]    Frequency of Communication with Friends and Family: More than three times a week    Frequency of Social Gatherings with Friends and Family: More than three times a week    Attends Religious Services: Never    Database administrator or Organizations: No    Attends Engineer, structural: Not on file    Marital Status: Separated  Intimate Partner Violence: Not At Risk (04/26/2019)   Humiliation, Afraid, Rape, and Kick questionnaire    Fear of Current or Ex-Partner: No    Emotionally Abused: No    Physically Abused: No    Sexually  Abused: No   Family History  Problem Relation Age of Onset   Diabetes Maternal Grandmother    Allergies  Allergen Reactions   Shrimp [Shellfish Allergy]       Patient Care Team: Zorita Hiss, NP as PCP - General (Nurse Practitioner)   Outpatient Medications Prior to Visit  Medication Sig   Ascorbic Acid (VITAMIN C ER PO) Take by mouth.   Calcium Carbonate (CALCIUM 500 PO) Take by mouth.   cetirizine  (ZYRTEC ) 10 MG tablet TAKE 1 TABLET(10 MG) BY MOUTH DAILY   Cholecalciferol 125 MCG (5000 UT) TABS Take 1 tablet by mouth daily.   clindamycin (CLEOCIN T) 1 % lotion Apply topically daily.   cyclobenzaprine  (FLEXERIL ) 10 MG tablet Take 1 tablet (10 mg total) by mouth 3 (three) times daily as needed for muscle spasms.   fluticasone  (FLONASE ) 50 MCG/ACT nasal spray Place 2 sprays into both nostrils daily.   Magnesium 500 MG CAPS Take by mouth.   naproxen  (NAPROSYN ) 500 MG tablet Take 1 tablet (500 mg total) by mouth 2 (two) times daily.   NON FORMULARY Take 2,500 mg by mouth at bedtime. Reishi mushroom supplement   omega-3 acid ethyl esters (LOVAZA) 1 g capsule Take 1 g by mouth 2 (two) times daily.   triamcinolone  cream (KENALOG ) 0.1 % Apply 1 Application topically 2 (two) times daily.   VITAMIN A PO Take by mouth.   VITAMIN B COMPLEX-C PO Take by mouth.   [DISCONTINUED] losartan -hydrochlorothiazide (HYZAAR) 50-12.5 MG tablet TAKE 1 TABLET BY MOUTH DAILY   No facility-administered medications prior to visit.    Review of Systems  HENT:  Positive for congestion.   Psychiatric/Behavioral:  The patient is nervous/anxious and has insomnia.   All other systems reviewed and are negative.         Objective:     BP (!) 148/100   Pulse 90   Temp 98.1 F (36.7 C) (Temporal)   Ht 5\' 10"  (1.778 m)   Wt 295 lb 8 oz (134 kg)   SpO2 94%   BMI 42.40 kg/m  BP Readings from Last 3 Encounters:  12/01/23 (!) 148/100  11/21/22 (!) 164/102  11/06/22 116/84   Wt Readings from Last  3 Encounters:  12/01/23 295 lb 8 oz (134 kg)  11/21/22 278 lb (126.1 kg)  08/05/22 278 lb 4 oz (126.2 kg)      Physical Exam Vitals reviewed.  Constitutional:      General: He is not in acute distress.    Appearance: Normal appearance. He is not ill-appearing.  HENT:     Head: Normocephalic and atraumatic.     Right Ear: Tympanic membrane, ear canal and external ear normal.     Left Ear: Tympanic membrane, ear canal and external ear normal.  Eyes:     General: No scleral icterus.    Extraocular Movements: Extraocular movements intact.     Conjunctiva/sclera: Conjunctivae normal.     Pupils: Pupils are equal, round, and reactive to light.  Neck:     Vascular: No carotid bruit.  Cardiovascular:     Rate and Rhythm: Normal rate and regular rhythm.     Pulses: Normal pulses.     Heart sounds: Normal heart sounds.  Pulmonary:     Effort: Pulmonary effort is normal.     Breath sounds: Normal breath sounds.  Abdominal:     General: Bowel sounds are normal. There is no distension.     Palpations: There is no mass.     Tenderness: There is no abdominal tenderness.     Hernia: No hernia is present.  Musculoskeletal:        General: No swelling or tenderness.     Cervical back: Normal range of motion and neck supple. No rigidity.  Lymphadenopathy:     Cervical: No cervical adenopathy.  Skin:    General: Skin is warm and dry.  Neurological:     General: No focal deficit present.     Mental Status: He is alert and oriented to person, place, and time.     Cranial Nerves: No cranial nerve deficit.     Sensory: No sensory deficit.     Motor: No weakness.     Gait: Gait normal.  Psychiatric:        Mood and Affect: Mood normal.        Behavior: Behavior normal.        Judgment: Judgment normal.       Results for orders placed or performed in visit on 12/01/23  Comprehensive metabolic panel with GFR  Result Value Ref Range   Sodium 137 135 - 145 mEq/L   Potassium 4.0 3.5 -  5.1 mEq/L   Chloride 101 96 - 112 mEq/L   CO2 29 19 - 32 mEq/L   Glucose, Bld 93 70 - 99 mg/dL   BUN 16 6 - 23 mg/dL   Creatinine, Ser 4.09 0.40 - 1.50 mg/dL   Total Bilirubin 0.4 0.2 - 1.2 mg/dL   Alkaline Phosphatase 91 39 - 117 U/L   AST 22 0 - 37 U/L   ALT 29 0 - 53 U/L   Total Protein 8.0 6.0 - 8.3 g/dL   Albumin 4.6 3.5 - 5.2 g/dL   GFR 811.91 >47.82 mL/min   Calcium 9.3 8.4 - 10.5 mg/dL  TSH  Result Value Ref Range   TSH 1.96 0.35 - 5.50 uIU/mL  Lipid panel  Result Value Ref Range   Cholesterol 197 0 - 200 mg/dL   Triglycerides 956.2 (H) 0.0 - 149.0 mg/dL   HDL 13.08 >65.78 mg/dL   VLDL 46.9 0.0 - 62.9 mg/dL   LDL Cholesterol 528 (H) 0 - 99 mg/dL   Total CHOL/HDL Ratio 5    NonHDL 157.68   Hemoglobin A1c  Result Value Ref Range   Hgb A1c MFr Bld 6.0 4.6 - 6.5 %  CBC  Result Value Ref Range   WBC 7.3 4.0 - 10.5 K/uL   RBC 5.03 4.22 - 5.81 Mil/uL   Platelets 372.0 150.0 - 400.0 K/uL  Hemoglobin 13.5 13.0 - 17.0 g/dL   HCT 16.1 09.6 - 04.5 %   MCV 80.8 78.0 - 100.0 fl   MCHC 33.2 30.0 - 36.0 g/dL   RDW 40.9 81.1 - 91.4 %       Assessment & Plan:    Routine Health Maintenance and Physical Exam  Immunization History  Administered Date(s) Administered   DTaP 12/20/2016   HPV 9-valent 12/20/2016   Hpv-Unspecified 12/20/2016   Influenza,inj,Quad PF,6+ Mos 04/26/2019   Meningococcal Conjugate 12/20/2016   Meningococcal Mcv4o 12/20/2016   Tdap 12/20/2016    Health Maintenance  Topic Date Due   Hepatitis C Screening  Never done   HPV VACCINES (2 - Male 3-dose series) 01/17/2017   INFLUENZA VACCINE  02/09/2024   DTaP/Tdap/Td (3 - Td or Tdap) 12/21/2026   HIV Screening  Completed   Meningococcal B Vaccine  Aged Out   COVID-19 Vaccine  Discontinued    Discussed health benefits of physical activity, and encouraged him to engage in regular exercise appropriate for his age and condition.   Problem List Items Addressed This Visit       Cardiovascular  and Mediastinum   Essential hypertension   Chronic Not well-controlled, however patient has not taken his antihypertensive. Patient encouraged to take his losartan -hydrochlorothiazide to day.   Metabolic panel identifies stable electrolytes and kidney function.  Refill of the losartan -hydrochlorothiazide 50-12.5 mg tablet to take 1 tab by mouth daily sent to pharmacy today. Patient to follow-up up in 1 month for repeat metabolic panel as well as for blood pressure check.      Relevant Medications   losartan -hydrochlorothiazide (HYZAAR) 50-12.5 MG tablet     Respiratory   Rhinitis   Has tried and failed antihistamine, nasal spray, montelukast.  Referral to ENT made for further assistance with management today.      Relevant Orders   Ambulatory referral to ENT     Other   Obesity, unspecified   Labs ordered, further recommendations may be made based upon his results       Relevant Orders   CBC (Completed)   Hemoglobin A1c (Completed)   Lipid panel (Completed)   TSH (Completed)   Comprehensive metabolic panel with GFR (Completed)   Testosterone Total,Free,Bio, Males   GAD (generalized anxiety disorder)   Chronic, GAD-7: 9 Shared decision discussion had today and we will plan on trialing Prozac 20 mg by mouth daily.  We discussed potential side effects including suicidal ideation.  And what to do this were to occur.  He was told to follow-up in approximately 4 to 6 weeks.      Relevant Medications   FLUoxetine (PROZAC) 20 MG capsule   Fatigue   Labs ordered, further recommendations may be made based upon these results Patient would like testosterone levels checked, he will come back to office on another day for testosterone level to be collected before 10 AM.      Relevant Orders   Testosterone Total,Free,Bio, Males   Encounter for general adult medical examination with abnormal findings - Primary   Discussed healthy lifestyle as well as health maintenance, handout  provided.      Return in about 1 month (around 01/01/2024) for F/U with either Britney or Padonda for BP check and anxiety.  In addition to annual physical exam also performed an office visit as detailed above.     Zorita Hiss, NP

## 2023-12-01 NOTE — Assessment & Plan Note (Signed)
 Discussed healthy lifestyle as well as health maintenance, handout provided.

## 2023-12-01 NOTE — Addendum Note (Signed)
 Addended by: Zorita Hiss on: 12/01/2023 05:37 PM   Modules accepted: Orders

## 2023-12-01 NOTE — Assessment & Plan Note (Signed)
 Labs ordered, further recommendations may be made based upon these results Patient would like testosterone levels checked, he will come back to office on another day for testosterone level to be collected before 10 AM.

## 2023-12-01 NOTE — Assessment & Plan Note (Signed)
 Chronic Not well-controlled, however patient has not taken his antihypertensive. Patient encouraged to take his losartan -hydrochlorothiazide to day.   Metabolic panel identifies stable electrolytes and kidney function.  Refill of the losartan -hydrochlorothiazide 50-12.5 mg tablet to take 1 tab by mouth daily sent to pharmacy today. Patient to follow-up up in 1 month for repeat metabolic panel as well as for blood pressure check.

## 2023-12-01 NOTE — Assessment & Plan Note (Addendum)
 Chronic, GAD-7: 9 Shared decision discussion had today and we will plan on trialing Prozac 20 mg by mouth daily.  We discussed potential side effects including suicidal ideation.  And what to do this were to occur.  He was told to follow-up in approximately 4 to 6 weeks.

## 2023-12-01 NOTE — Assessment & Plan Note (Signed)
 Has tried and failed antihistamine, nasal spray, montelukast.  Referral to ENT made for further assistance with management today.

## 2023-12-01 NOTE — Assessment & Plan Note (Signed)
 Labs ordered, further recommendations may be made based upon his results.

## 2024-01-09 ENCOUNTER — Encounter: Payer: Self-pay | Admitting: Nurse Practitioner

## 2024-01-29 ENCOUNTER — Other Ambulatory Visit

## 2024-01-29 DIAGNOSIS — E66813 Obesity, class 3: Secondary | ICD-10-CM

## 2024-01-29 DIAGNOSIS — R5383 Other fatigue: Secondary | ICD-10-CM

## 2024-01-29 DIAGNOSIS — Z113 Encounter for screening for infections with a predominantly sexual mode of transmission: Secondary | ICD-10-CM

## 2024-01-30 LAB — TESTOSTERONE TOTAL,FREE,BIO, MALES
Albumin: 4.7 g/dL (ref 3.6–5.1)
Sex Hormone Binding: 28 nmol/L (ref 10–50)
Testosterone, Bioavailable: 149.9 ng/dL (ref 110.0–575.0)
Testosterone, Free: 69.9 pg/mL (ref 46.0–224.0)
Testosterone: 465 ng/dL (ref 250–827)

## 2024-01-30 LAB — RPR: RPR Ser Ql: NONREACTIVE

## 2024-01-30 LAB — HIV ANTIBODY (ROUTINE TESTING W REFLEX): HIV 1&2 Ab, 4th Generation: NONREACTIVE

## 2024-02-01 LAB — CHLAMYDIA/NEISSERIA GONORRHOEAE RNA,TMA,UROGENTIAL
C. trachomatis RNA, TMA: NOT DETECTED
N. gonorrhoeae RNA, TMA: NOT DETECTED

## 2024-02-08 ENCOUNTER — Telehealth (INDEPENDENT_AMBULATORY_CARE_PROVIDER_SITE_OTHER): Payer: Self-pay | Admitting: Otolaryngology

## 2024-02-08 NOTE — Telephone Encounter (Signed)
 Confirmed appt & location [phone] [id number]

## 2024-02-09 ENCOUNTER — Encounter (INDEPENDENT_AMBULATORY_CARE_PROVIDER_SITE_OTHER): Payer: Self-pay | Admitting: Otolaryngology

## 2024-02-09 ENCOUNTER — Ambulatory Visit (INDEPENDENT_AMBULATORY_CARE_PROVIDER_SITE_OTHER): Admitting: Otolaryngology

## 2024-02-09 VITALS — BP 148/86 | HR 70

## 2024-02-09 DIAGNOSIS — J3089 Other allergic rhinitis: Secondary | ICD-10-CM

## 2024-02-09 DIAGNOSIS — J342 Deviated nasal septum: Secondary | ICD-10-CM | POA: Diagnosis not present

## 2024-02-09 DIAGNOSIS — R0981 Nasal congestion: Secondary | ICD-10-CM | POA: Diagnosis not present

## 2024-02-09 DIAGNOSIS — R0982 Postnasal drip: Secondary | ICD-10-CM

## 2024-02-09 DIAGNOSIS — J343 Hypertrophy of nasal turbinates: Secondary | ICD-10-CM

## 2024-02-09 DIAGNOSIS — J3489 Other specified disorders of nose and nasal sinuses: Secondary | ICD-10-CM

## 2024-02-09 MED ORDER — LEVOCETIRIZINE DIHYDROCHLORIDE 5 MG PO TABS: 5.0000 mg | ORAL_TABLET | Freq: Every evening | ORAL | 3 refills | Status: AC

## 2024-02-09 MED ORDER — FLUTICASONE PROPIONATE 50 MCG/ACT NA SUSP: 2.0000 | Freq: Two times a day (BID) | NASAL | 6 refills | Status: AC

## 2024-02-09 NOTE — Patient Instructions (Signed)
 Jackson Wallace Med Nasal Saline Rinse   - start nasal saline rinses with [employer] available over the counter or online to help with nasal congestion

## 2024-02-09 NOTE — Progress Notes (Signed)
 ENT CONSULT:  Reason for Consult: chronic nasal congestion  left sided nasal obstruction  HPI: Discussed the use of AI scribe software for clinical note transcription with the patient, who gave verbal consent to proceed.  History of Present Illness Jackson Wallace is a 33 year old male who presents with difficulty breathing through his left nostril and chronic nasal congestion   He experiences difficulty breathing through his left nostril, describing a sensation of pressure and blockage. This issue has persisted since a childhood facial injury sustained while playing soccer. He describes the left side as feeling 'clogged up' and 'tight'.  He has been prescribed Zyrtec  and Flonase  in the past and tried it for several months, but these medications did not alleviate his symptoms. He has not used any other nasal sprays or allergy pills except for over-the-counter Vicks VapoRub. Allergy testing revealed no significant allergies to pollen or grass, only a reaction to shellfish.  His sense of smell remains intact. He has not undergone any surgeries or general anesthesia in the past, only local anesthesia for dental procedures. No allergies to pollen or grass and confirms a good sense of smell.     Records Reviewed:  PCP visit 12/01/23 Rhinitis     Has tried and failed antihistamine, nasal spray, montelukast.  Referral to ENT made for further assistance with management today.        Relevant Orders    Ambulatory referral to ENT      Past Medical History:  Diagnosis Date   Allergy    Hypertension     History reviewed. No pertinent surgical history.  Family History  Problem Relation Age of Onset   Diabetes Maternal Grandmother     Social History:  reports that he has never smoked. He has never used smokeless tobacco. He reports that he does not drink alcohol and does not use drugs.  Allergies:  Allergies  Allergen Reactions   Shrimp [Shellfish Allergy]      Medications: I have reviewed the patient's current medications.  The PMH, PSH, Medications, Allergies, and SH were reviewed and updated.  ROS: Constitutional: Negative for fever, weight loss and weight gain. Cardiovascular: Negative for chest pain and dyspnea on exertion. Respiratory: Is not experiencing shortness of breath at rest. Gastrointestinal: Negative for nausea and vomiting. Neurological: Negative for headaches. Psychiatric: The patient is not nervous/anxious  Blood pressure (!) 148/86, pulse 70, SpO2 98%. There is no height or weight on file to calculate BMI.  PHYSICAL EXAM:  Exam: General: Well-developed, well-nourished Communication and Voice: Clear pitch and clarity Respiratory Respiratory effort: Equal inspiration and expiration without stridor Cardiovascular Peripheral Vascular: Warm extremities with equal color/perfusion Eyes: No nystagmus with equal extraocular motion bilaterally Neuro/Psych/Balance: Patient oriented to person, place, and time; Appropriate mood and affect; Gait is intact with no imbalance; Cranial nerves I-XII are intact Head and Face Inspection: Normocephalic and atraumatic without mass or lesion Palpation: Facial skeleton intact without bony stepoffs Salivary Glands: No mass or tenderness Facial Strength: Facial motility symmetric and full bilaterally ENT Pinna: External ear intact and fully developed External canal: Canal is patent with intact skin Tympanic Membrane: Clear and mobile External Nose: No scar or anatomic deformity Internal Nose: Septum is deviated to the left with significant narrowing of nasal passage. No polyp, or purulence. Mucosal edema and erythema present.  Bilateral inferior turbinate hypertrophy.  Lips, Teeth, and gums: Mucosa and teeth intact and viable TMJ: No pain to palpation with full mobility Oral cavity/oropharynx: No erythema or  exudate, no lesions present Nasopharynx: No mass or lesion with intact  mucosa Neck Neck and Trachea: Midline trachea without mass or lesion Thyroid : No mass or nodularity Lymphatics: No lymphadenopathy  Procedure:   PROCEDURE NOTE: nasal endoscopy  Preoperative diagnosis: chronic sinusitis symptoms  Postoperative diagnosis: same  Procedure: Diagnostic nasal endoscopy (68768)  Surgeon: Elena Larry, M.D.  Anesthesia: Topical lidocaine and Afrin  H&P REVIEW: The patient's history and physical were reviewed today prior to procedure. All medications were reviewed and updated as well. Complications: None Condition is stable throughout exam Indications and consent: The patient presents with symptoms of chronic sinusitis not responding to previous therapies. All the risks, benefits, and potential complications were reviewed with the patient preoperatively and informed consent was obtained. The time out was completed with confirmation of the correct procedure.   Procedure: The patient was seated upright in the clinic. Topical lidocaine and Afrin were applied to the nasal cavity. After adequate anesthesia had occurred, the rigid nasal endoscope was passed into the nasal cavity. The nasal mucosa, turbinates, septum, and sinus drainage pathways were visualized bilaterally. This revealed no purulence or significant secretions that might be cultured. There were no polyps or sites of significant inflammation. The mucosa was intact and there was no crusting present. The scope was then slowly withdrawn and the patient tolerated the procedure well. There were no complications or blood loss.   Studies Reviewed: CT head 11/21/22 Sinuses/Orbits: The visualized orbits are unremarkable. The visualized paranasal sinuses and mastoid air cells are clear.  Assessment/Plan: Encounter Diagnoses  Name Primary?   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 CT scan of the head w/o CRS, no polyps. Previous medications ineffective, tried antihistamine and Flonase  for several months without relief. Surgical intervention discussed for improved nasal breathing. Septoplasty and turbinate reduction are outpatient with short recovery. Surgery may aid CPAP tolerance but not cure sleep apnea. He has OSA diagnosis.  - Prescribed Xyzal and Flonase  to resume medical management. - Schedule septoplasty and inferior turbinate reduction for January (he would like to wait till then) - Advised to book pre-surgery appointment in December.  Thank you for allowing me to participate in the care of this patient. Please do not hesitate to contact me with any questions or concerns.   Elena Larry, MD Otolaryngology [facility] Phone: [phone] Fax: [phone]    02/09/2024, 8:56 AM

## 2024-03-12 ENCOUNTER — Other Ambulatory Visit: Payer: Self-pay | Admitting: Nurse Practitioner

## 2024-03-12 DIAGNOSIS — R21 Rash and other nonspecific skin eruption: Secondary | ICD-10-CM

## 2024-03-15 ENCOUNTER — Ambulatory Visit: Admitting: Nurse Practitioner

## 2024-03-17 ENCOUNTER — Encounter (INDEPENDENT_AMBULATORY_CARE_PROVIDER_SITE_OTHER): Payer: Self-pay

## 2024-03-18 ENCOUNTER — Other Ambulatory Visit: Payer: Self-pay | Admitting: Medical Genetics

## 2024-03-22 ENCOUNTER — Ambulatory Visit (INDEPENDENT_AMBULATORY_CARE_PROVIDER_SITE_OTHER): Admitting: Nurse Practitioner

## 2024-03-22 VITALS — BP 130/80 | HR 73 | Temp 98.5°F | Ht 70.0 in | Wt 274.0 lb

## 2024-03-22 DIAGNOSIS — I10 Essential (primary) hypertension: Secondary | ICD-10-CM

## 2024-03-22 DIAGNOSIS — F411 Generalized anxiety disorder: Secondary | ICD-10-CM | POA: Diagnosis not present

## 2024-03-22 DIAGNOSIS — M5431 Sciatica, right side: Secondary | ICD-10-CM

## 2024-03-22 MED ORDER — CYCLOBENZAPRINE HCL 10 MG PO TABS
10.0000 mg | ORAL_TABLET | Freq: Three times a day (TID) | ORAL | 0 refills | Status: AC | PRN
Start: 2024-03-22 — End: ?

## 2024-03-22 MED ORDER — NAPROXEN 500 MG PO TABS
500.0000 mg | ORAL_TABLET | Freq: Two times a day (BID) | ORAL | 0 refills | Status: AC
Start: 2024-03-22 — End: ?

## 2024-03-22 NOTE — Assessment & Plan Note (Signed)
 Essential hypertension Blood pressure well-controlled with lifestyle and medication. Home readings 130-135/80 mmHg, occasional 140 mmHg during stress. Off medication 4-5 days, prefers current regimen. - Continue losartan /hydrochlorothiazide 50/12.5 mg once daily.

## 2024-03-22 NOTE — Progress Notes (Signed)
 Established Patient Office Visit  Subjective   Patient ID: Jackson Wallace, male    DOB: 07-27-1990  Age: 33 y.o. MRN: [id number]  Chief Complaint  Patient presents with   Anxiety    Discussed the use of AI scribe software for clinical note transcription with the patient, who gave verbal consent to proceed.  History of Present Illness Jackson Wallace is a 33 year old male who presents for a follow-up visit.  Anxiety symptoms - Takes fluoxetine  20 mg daily - Significant improvement in anxiety symptoms - No muscle aches - No thoughts of self-harm or harm to others - No side effects such as vivid dreams, night sweats, or sexual dysfunction  Hypertension management - Takes losartan  hydrochlorothiazide 50/12.5 mg daily - Has not taken antihypertensive medication for the past 4-5 days - Home blood pressure readings range from 130-135 mmHg, occasionally up to 140 mmHg with stress - Engaged in cardiovascular exercise  Right sciatic nerve pain - pain started >1 week ago - Symptoms are improving - Pressure with standing - Pain with sitting or lying down, rated 7/10 - Naproxen  has been effective for similar issues in the past - Has used cyclobenzaprine  without issues  Nasal obstruction - Deviated septum surgery scheduled for January next year      ROS: see HPI    Objective:     BP 130/80   Pulse 73   Temp 98.5 F (36.9 C) (Temporal)   Ht 5' 10 (1.778 m)   Wt 274 lb (124.3 kg)   SpO2 98%   BMI 39.31 kg/m  BP Readings from Last 3 Encounters:  03/22/24 130/80  02/09/24 (!) 148/86  12/01/23 (!) 148/100   Wt Readings from Last 3 Encounters:  03/22/24 274 lb (124.3 kg)  12/01/23 295 lb 8 oz (134 kg)  11/21/22 278 lb (126.1 kg)          12/01/2023    1:16 PM  GAD 7 : Generalized Anxiety Score  Nervous, Anxious, on Edge 1  Control/stop worrying 0  Worry too much - different things 3  Trouble relaxing 3  Restless 2  Easily  annoyed or irritable 0  Afraid - awful might happen 0  Total GAD 7 Score 9     Physical Exam Vitals reviewed.  Constitutional:      Appearance: Normal appearance.  HENT:     Head: Normocephalic and atraumatic.  Cardiovascular:     Rate and Rhythm: Normal rate and regular rhythm.  Pulmonary:     Effort: Pulmonary effort is normal.     Breath sounds: Normal breath sounds.  Musculoskeletal:     Cervical back: Neck supple.     Lumbar back: Positive right straight leg raise test.  Skin:    General: Skin is warm and dry.  Neurological:     Mental Status: He is alert and oriented to person, place, and time.  Psychiatric:        Mood and Affect: Mood normal.        Behavior: Behavior normal.        Thought Content: Thought content normal.        Judgment: Judgment normal.      No results found for any visits on 03/22/24.    The ASCVD Risk score (Arnett DK, et al., 2019) failed to calculate for the following reasons:   The 2019 ASCVD risk score is only valid for ages 10 to 46    Assessment & Plan:  Problem List Items Addressed This Visit       Cardiovascular and Mediastinum   Essential hypertension   Essential hypertension Blood pressure well-controlled with lifestyle and medication. Home readings 130-135/80 mmHg, occasional 140 mmHg during stress. Off medication 4-5 days, prefers current regimen. - Continue losartan /hydrochlorothiazide 50/12.5 mg once daily.        Nervous and Auditory   Sciatica of right side   Right-sided sciatica Pain rated 7/10, pressure when standing, occasional discomfort sitting or lying. No numbness or weakness. Symptoms improving. - Prescribe naproxen  500 mg orally twice daily as needed, with food. - Prescribe cyclobenzaprine  10 mg orally every 8 hours as needed, caution about sedation, advise against driving until effects known.      Relevant Medications   naproxen  (NAPROSYN ) 500 MG tablet   cyclobenzaprine  (FLEXERIL ) 10 MG tablet      Other   GAD (generalized anxiety disorder) - Primary   Generalized anxiety disorder Anxiety well-controlled with current medication. Significant symptom reduction, improved sleep, no side effects, no self-harm thoughts. - Continue Prozac  20 mg once daily.      Assessment and Plan Assessment & Plan Generalized anxiety disorder Anxiety well-controlled with current medication. Significant symptom reduction, improved sleep, no side effects, no self-harm thoughts. - Continue Prozac  20 mg once daily.  Essential hypertension Blood pressure well-controlled with lifestyle and medication. Home readings 130-135/80 mmHg, occasional 140 mmHg during stress. Off medication 4-5 days, prefers current regimen. - Continue losartan /hydrochlorothiazide 50/12.5 mg once daily.  Right-sided sciatica Pain rated 7/10, pressure when standing, occasional discomfort sitting or lying. No numbness or weakness. Symptoms improving. - Prescribe naproxen  500 mg orally twice daily as needed, with food. - Prescribe cyclobenzaprine  10 mg orally every 8 hours as needed, caution about sedation, advise against driving until effects known.   Return in about 9 months (around 12/20/2024) for CPE with Charisse Wendell.    Lauraine FORBES Pereyra, NP

## 2024-03-22 NOTE — Assessment & Plan Note (Signed)
 Right-sided sciatica Pain rated 7/10, pressure when standing, occasional discomfort sitting or lying. No numbness or weakness. Symptoms improving. - Prescribe naproxen  500 mg orally twice daily as needed, with food. - Prescribe cyclobenzaprine  10 mg orally every 8 hours as needed, caution about sedation, advise against driving until effects known.

## 2024-03-22 NOTE — Assessment & Plan Note (Signed)
 Generalized anxiety disorder Anxiety well-controlled with current medication. Significant symptom reduction, improved sleep, no side effects, no self-harm thoughts. - Continue Prozac  20 mg once daily.

## 2024-03-27 ENCOUNTER — Other Ambulatory Visit

## 2024-05-06 ENCOUNTER — Telehealth (INDEPENDENT_AMBULATORY_CARE_PROVIDER_SITE_OTHER): Payer: Self-pay

## 2024-05-06 NOTE — Telephone Encounter (Signed)
 Left vm to r/s 07/12/24 appointment

## 2024-06-22 ENCOUNTER — Other Ambulatory Visit: Payer: Self-pay | Admitting: Nurse Practitioner

## 2024-06-22 DIAGNOSIS — F411 Generalized anxiety disorder: Secondary | ICD-10-CM

## 2024-07-03 ENCOUNTER — Other Ambulatory Visit

## 2024-07-12 ENCOUNTER — Ambulatory Visit (INDEPENDENT_AMBULATORY_CARE_PROVIDER_SITE_OTHER): Admitting: Otolaryngology

## 2024-07-22 ENCOUNTER — Other Ambulatory Visit: Payer: Self-pay | Admitting: Medical Genetics

## 2024-07-22 DIAGNOSIS — Z006 Encounter for examination for normal comparison and control in clinical research program: Secondary | ICD-10-CM

## 2024-07-25 ENCOUNTER — Encounter (INDEPENDENT_AMBULATORY_CARE_PROVIDER_SITE_OTHER): Payer: Self-pay

## 2024-07-25 ENCOUNTER — Ambulatory Visit (INDEPENDENT_AMBULATORY_CARE_PROVIDER_SITE_OTHER)

## 2024-07-25 VITALS — BP 127/87 | HR 87 | Ht 71.0 in | Wt 280.0 lb

## 2024-07-25 DIAGNOSIS — J3489 Other specified disorders of nose and nasal sinuses: Secondary | ICD-10-CM

## 2024-07-25 DIAGNOSIS — J343 Hypertrophy of nasal turbinates: Secondary | ICD-10-CM

## 2024-07-25 DIAGNOSIS — J342 Deviated nasal septum: Secondary | ICD-10-CM

## 2024-07-25 MED ORDER — MUPIROCIN 2 % EX OINT: 1.0000 | TOPICAL_OINTMENT | Freq: Two times a day (BID) | CUTANEOUS | 0 refills | Status: AC

## 2024-07-25 NOTE — Progress Notes (Signed)
 Dear Dr. Elnor, Here is my assessment for our mutual patient, Jackson Wallace. Thank you for allowing me the opportunity to care for your patient. Please do not hesitate to contact me should you have any other questions. Sincerely, Dr. Hadassah Parody  Otolaryngology Clinic Note Referring provider: Dr. Elnor HPI:   Initial HPI (02/09/24) Jackson Wallace is a 34 year old male who presents with difficulty breathing through his left nostril and chronic nasal congestion   He experiences difficulty breathing through his left nostril, describing a sensation of pressure and blockage. This issue has persisted since a childhood facial injury sustained while playing soccer. He describes the left side as feeling 'clogged up' and 'tight'.  He has been prescribed Zyrtec  and Flonase  in the past and tried it for several months, but these medications did not alleviate his symptoms. He has not used any other nasal sprays or allergy pills except for over-the-counter Vicks VapoRub. Allergy testing revealed no significant allergies to pollen or grass, only a reaction to shellfish.  His sense of smell remains intact. He has not undergone any surgeries or general anesthesia in the past, only local anesthesia for dental procedures. No allergies to pollen or grass and confirms a good sense of smell.  --------------------------------------------------------- 07/25/2024  Presents today for follow-up.  He has been consistently using Flonase  as well as saline nasal spray with no improvement in his nasal obstruction.  He continues to have significant left-sided nasal obstruction that impacts his daily living.  He also has intolerance of his CPAP due to the degree of the left-sided nasal blockage.  He is interested in surgical correction.   Independent Review of Additional Tests or Records:  CT head 11/21/2022 independently reviewed showing leftward nasal septal deviation with a bony spur, bilateral  inferior turbinate hypertrophy   PMH/Meds/All/SocHx/FamHx/ROS:   Past Medical History:  Diagnosis Date   Allergy    Hypertension      No past surgical history on file.  Family History  Problem Relation Age of Onset   Diabetes Maternal Grandmother      Social Connections: Unknown (03/18/2024)   Social Connection and Isolation Panel    Frequency of Communication with Friends and Family: Three times a week    Frequency of Social Gatherings with Friends and Family: Twice a week    Attends Religious Services: Never    Database Administrator or Organizations: No    Attends Engineer, Structural: Not on file    Marital Status: Patient declined     Current Outpatient Medications  Medication Instructions   Ascorbic Acid (VITAMIN C ER PO) Take by mouth.   Calcium Carbonate (CALCIUM 500 PO) Take by mouth.   Cholecalciferol 125 MCG (5000 UT) TABS 1 tablet, Daily   clindamycin (CLEOCIN T) 1 % lotion Daily   cyclobenzaprine  (FLEXERIL ) 10 mg, Oral, 3 times daily PRN   FLUoxetine  (PROZAC ) 20 MG capsule TAKE 1 CAPSULE(20 MG) BY MOUTH DAILY   fluticasone  (FLONASE ) 50 MCG/ACT nasal spray 2 sprays, Each Nare, 2 times daily   levocetirizine (XYZAL  ALLERGY 24HR) 5 mg, Oral, Every evening   losartan -hydrochlorothiazide (HYZAAR) 50-12.5 MG tablet 1 tablet, Oral, Daily   Magnesium 500 MG CAPS Take by mouth.   mupirocin  ointment (BACTROBAN ) 2 % 1 Application, Topical, 2 times daily   naproxen  (NAPROSYN ) 500 mg, Oral, 2 times daily   NON FORMULARY 2,500 mg, Daily at bedtime   omega-3 acid ethyl esters (LOVAZA) 1 g, 2 times daily  triamcinolone  cream (KENALOG ) 0.1 % APPLY TOPICALLY TO THE AFFECTED AREA TWICE DAILY   VITAMIN A PO Take by mouth.   VITAMIN B COMPLEX-C PO Take by mouth.     Physical Exam:   BP 127/87 (BP Location: Right Arm, Patient Position: Sitting)   Pulse 87   Ht 5' 11 (1.803 m)   Wt 280 lb (127 kg)   SpO2 96%   BMI 39.05 kg/m   Salient findings:  CN II-XII  intact   Bilateral EAC clear and TM intact with well pneumatized middle ear spaces  Anterior rhinoscopy: Very narrow external nasal valve on the left; bilateral inferior turbinates with hypertrophy Nasal endoscopy was indicated to better evaluate the nose and paranasal sinuses, given the patient's history and exam findings, and is detailed below.   No lesions of oral cavity/oropharynx  No obviously palpable neck masses/lymphadenopathy/thyromegaly  No respiratory distress or stridor  Seprately Identifiable Procedures:  Prior to initiating any procedures, risks/benefits/alternatives were explained to the patient and verbal consent obtained.  PROCEDURE (07/25/2024): Bilateral Diagnostic Rigid Nasal Endoscopy Pre-procedure diagnosis: Concern for nasal obstruction Post-procedure diagnosis: same Indication: See pre-procedure diagnosis and physical exam above Complications: None apparent EBL: 0 mL Anesthesia: Lidocaine 4% and topical decongestant was topically sprayed in each nasal cavity  Description of Procedure:  Patient was identified. A rigid 30 degree endoscope was utilized to evaluate the sinonasal cavities, mucosa, sinus ostia and turbinates and septum.  Overall, signs of mucosal inflammation are noted.  Also noted are yellow crusting and dryness on the left nasal cavity overlying the left septum, leftward nasal septal deviation that is significant and impedes ability to pass scope to the posterior nasopharynx on the left side.  No mucopurulence, polyps, or masses noted.   Right Middle meatus: clear Right SE Recess: clear Left MM: clear Left SE Recess: unable to visualize due to septal deviation  Photodocumentation was obtained.  CPT CODE -- 68768 - Mod 25   Impression & Plans:  Jackson Wallace is a 34 y.o. male with   1. Nasal obstruction   2. Hypertrophy of both inferior nasal turbinates   3. Nasal septal deviation   4. Nasal crusting    Assessment and  Plan Assessment & Plan Deviated nasal septum with turbinate hypertrophy Nasal obstruction Chronic, severe nasal obstruction due to significant septal deviation with a prominent left-sided septal spur and bilateral turbinate hypertrophy, refractory to intranasal corticosteroids and saline sprays. This condition is significantly impairing quality of life and CPAP tolerance. Septoplasty with bilateral turbinate reduction is indicated as definitive management due to failure of medical therapy and anatomical obstruction. Anticipated outcome is improved nasal airflow and CPAP tolerance.   We discussed R/B/A including pain, infection, bleeding (small risk of operative visit for control), persistent symptoms, need for revision surgery, and other risks including damage to surrounding structures, septal perforation, and injury to skull base with risk of CSF leak and additional intracranial complications, anesthetic complications, among others.  We discussed use of nasal saline spray and nasal saline irrigations post-operatively We also discussed use of intranasal steroid post-operatively until healing occurs Patient understands and is ready to proceed.  - Placed order for septoplasty with bilateral turbinate reduction and initiated scheduling process. - Explained surgical approach, perioperative expectations, and postoperative care, including nasal splints for approximately three days. - Advised discontinuation of Flonase  until after surgery. - Advised continuation of saline nasal spray for symptomatic relief.  Nasal crusting Nasal crusting with superimposed bacterial infection at left septum  -  Prescribed topical antibiotic ointment to be applied to the affected area for one week.    See below regarding exact medications prescribed this encounter including dosages and route: Meds ordered this encounter  Medications   mupirocin  ointment (BACTROBAN ) 2 %    Sig: Apply 1 Application topically 2 (two)  times daily for 7 days.    Dispense:  14 g    Refill:  0      Thank you for allowing me the opportunity to care for your patient. Please do not hesitate to contact me should you have any other questions.  Sincerely, Hadassah Parody, MD Otolaryngologist (ENT), [facility] Phone: [phone] Fax: [phone]  MDM:  Level 4 Complexity/Problems addressed:  Data complexity: 4-  independent review of CT - Morbidity: 4- discussed surgery and meds mgmt  - Prescription Drug prescribed or managed: yes

## 2024-08-09 ENCOUNTER — Encounter (INDEPENDENT_AMBULATORY_CARE_PROVIDER_SITE_OTHER): Payer: Self-pay

## 2024-08-12 ENCOUNTER — Other Ambulatory Visit

## 2024-08-13 ENCOUNTER — Other Ambulatory Visit (INDEPENDENT_AMBULATORY_CARE_PROVIDER_SITE_OTHER): Payer: Self-pay

## 2024-08-13 DIAGNOSIS — J3489 Other specified disorders of nose and nasal sinuses: Secondary | ICD-10-CM

## 2024-08-13 DIAGNOSIS — J343 Hypertrophy of nasal turbinates: Secondary | ICD-10-CM

## 2024-08-13 DIAGNOSIS — J342 Deviated nasal septum: Secondary | ICD-10-CM

## 2024-08-30 ENCOUNTER — Encounter (INDEPENDENT_AMBULATORY_CARE_PROVIDER_SITE_OTHER)
